# Patient Record
Sex: Female | Born: 1935 | Race: White | Hispanic: No | Smoking: Never smoker
Health system: Southern US, Community
[De-identification: ages and names within clinical notes are randomized; demographics above are authoritative.]

## PROBLEM LIST (undated history)

## (undated) DIAGNOSIS — R0981 Nasal congestion: Secondary | ICD-10-CM

## (undated) DIAGNOSIS — R05 Cough: Secondary | ICD-10-CM

## (undated) DIAGNOSIS — C801 Malignant (primary) neoplasm, unspecified: Secondary | ICD-10-CM

## (undated) DIAGNOSIS — R059 Cough, unspecified: Secondary | ICD-10-CM

## (undated) DIAGNOSIS — G4730 Sleep apnea, unspecified: Secondary | ICD-10-CM

## (undated) DIAGNOSIS — E785 Hyperlipidemia, unspecified: Secondary | ICD-10-CM

## (undated) DIAGNOSIS — I10 Essential (primary) hypertension: Secondary | ICD-10-CM

## (undated) DIAGNOSIS — C50919 Malignant neoplasm of unspecified site of unspecified female breast: Secondary | ICD-10-CM

## (undated) DIAGNOSIS — J329 Chronic sinusitis, unspecified: Secondary | ICD-10-CM

## (undated) DIAGNOSIS — M1990 Unspecified osteoarthritis, unspecified site: Secondary | ICD-10-CM

## (undated) DIAGNOSIS — A072 Cryptosporidiosis: Secondary | ICD-10-CM

## (undated) DIAGNOSIS — Z923 Personal history of irradiation: Secondary | ICD-10-CM

## (undated) DIAGNOSIS — E079 Disorder of thyroid, unspecified: Secondary | ICD-10-CM

## (undated) DIAGNOSIS — K219 Gastro-esophageal reflux disease without esophagitis: Secondary | ICD-10-CM

## (undated) HISTORY — PX: DILATION AND CURETTAGE OF UTERUS: SHX78

## (undated) HISTORY — PX: BREAST SURGERY: SHX581

## (undated) HISTORY — DX: Nasal congestion: R09.81

## (undated) HISTORY — DX: Gastro-esophageal reflux disease without esophagitis: K21.9

## (undated) HISTORY — DX: Malignant (primary) neoplasm, unspecified: C80.1

## (undated) HISTORY — PX: CARPAL TUNNEL RELEASE: SHX101

## (undated) HISTORY — DX: Cough, unspecified: R05.9

## (undated) HISTORY — DX: Disorder of thyroid, unspecified: E07.9

## (undated) HISTORY — DX: Hyperlipidemia, unspecified: E78.5

## (undated) HISTORY — DX: Sleep apnea, unspecified: G47.30

## (undated) HISTORY — DX: Cryptosporidiosis: A07.2

## (undated) HISTORY — DX: Chronic sinusitis, unspecified: J32.9

## (undated) HISTORY — PX: TONSILLECTOMY: SUR1361

## (undated) HISTORY — PX: OTHER SURGICAL HISTORY: SHX169

## (undated) HISTORY — PX: BREAST BIOPSY: SHX20

## (undated) HISTORY — DX: Essential (primary) hypertension: I10

## (undated) HISTORY — PX: ROTATOR CUFF REPAIR: SHX139

## (undated) HISTORY — DX: Unspecified osteoarthritis, unspecified site: M19.90

## (undated) HISTORY — PX: CHOLECYSTECTOMY: SHX55

## (undated) HISTORY — DX: Cough: R05

---

## 1998-07-24 ENCOUNTER — Ambulatory Visit: Admission: RE | Admit: 1998-07-24 | Discharge: 1998-07-24 | Payer: Self-pay | Admitting: Internal Medicine

## 1998-11-22 ENCOUNTER — Other Ambulatory Visit: Admission: RE | Admit: 1998-11-22 | Discharge: 1998-11-22 | Payer: Self-pay | Admitting: Obstetrics and Gynecology

## 1998-12-16 ENCOUNTER — Ambulatory Visit (HOSPITAL_COMMUNITY): Admission: RE | Admit: 1998-12-16 | Discharge: 1998-12-16 | Payer: Self-pay | Admitting: Obstetrics and Gynecology

## 1998-12-16 ENCOUNTER — Encounter: Payer: Self-pay | Admitting: Obstetrics and Gynecology

## 1998-12-19 ENCOUNTER — Other Ambulatory Visit: Admission: RE | Admit: 1998-12-19 | Discharge: 1998-12-19 | Payer: Self-pay | Admitting: Obstetrics and Gynecology

## 1999-03-04 ENCOUNTER — Ambulatory Visit (HOSPITAL_COMMUNITY): Admission: RE | Admit: 1999-03-04 | Discharge: 1999-03-04 | Payer: Self-pay | Admitting: Internal Medicine

## 1999-03-19 ENCOUNTER — Other Ambulatory Visit: Admission: RE | Admit: 1999-03-19 | Discharge: 1999-03-19 | Payer: Self-pay | Admitting: Obstetrics and Gynecology

## 1999-12-15 ENCOUNTER — Encounter: Admission: RE | Admit: 1999-12-15 | Discharge: 1999-12-15 | Payer: Self-pay | Admitting: Obstetrics and Gynecology

## 1999-12-15 ENCOUNTER — Encounter: Payer: Self-pay | Admitting: Obstetrics and Gynecology

## 2000-03-19 ENCOUNTER — Other Ambulatory Visit: Admission: RE | Admit: 2000-03-19 | Discharge: 2000-03-19 | Payer: Self-pay | Admitting: Obstetrics and Gynecology

## 2000-08-02 ENCOUNTER — Encounter: Admission: RE | Admit: 2000-08-02 | Discharge: 2000-10-31 | Payer: Self-pay | Admitting: Rheumatology

## 2000-10-15 ENCOUNTER — Other Ambulatory Visit: Admission: RE | Admit: 2000-10-15 | Discharge: 2000-10-15 | Payer: Self-pay | Admitting: Obstetrics and Gynecology

## 2000-10-15 ENCOUNTER — Encounter (INDEPENDENT_AMBULATORY_CARE_PROVIDER_SITE_OTHER): Payer: Self-pay

## 2000-10-18 ENCOUNTER — Ambulatory Visit (HOSPITAL_COMMUNITY): Admission: RE | Admit: 2000-10-18 | Discharge: 2000-10-18 | Payer: Self-pay | Admitting: Obstetrics and Gynecology

## 2000-10-18 ENCOUNTER — Encounter: Payer: Self-pay | Admitting: Obstetrics and Gynecology

## 2001-02-09 ENCOUNTER — Ambulatory Visit (HOSPITAL_BASED_OUTPATIENT_CLINIC_OR_DEPARTMENT_OTHER): Admission: RE | Admit: 2001-02-09 | Discharge: 2001-02-10 | Payer: Self-pay | Admitting: Orthopedic Surgery

## 2001-06-03 ENCOUNTER — Encounter: Payer: Self-pay | Admitting: Obstetrics and Gynecology

## 2001-06-03 ENCOUNTER — Encounter: Admission: RE | Admit: 2001-06-03 | Discharge: 2001-06-03 | Payer: Self-pay | Admitting: Obstetrics and Gynecology

## 2001-07-11 ENCOUNTER — Other Ambulatory Visit: Admission: RE | Admit: 2001-07-11 | Discharge: 2001-07-11 | Payer: Self-pay | Admitting: Obstetrics and Gynecology

## 2002-06-06 ENCOUNTER — Encounter: Admission: RE | Admit: 2002-06-06 | Discharge: 2002-06-06 | Payer: Self-pay | Admitting: Internal Medicine

## 2002-06-06 ENCOUNTER — Encounter: Payer: Self-pay | Admitting: Obstetrics and Gynecology

## 2003-06-15 ENCOUNTER — Encounter: Admission: RE | Admit: 2003-06-15 | Discharge: 2003-06-15 | Payer: Self-pay | Admitting: Obstetrics and Gynecology

## 2003-06-15 ENCOUNTER — Encounter: Payer: Self-pay | Admitting: Obstetrics and Gynecology

## 2004-07-09 ENCOUNTER — Encounter: Admission: RE | Admit: 2004-07-09 | Discharge: 2004-07-09 | Payer: Self-pay | Admitting: Obstetrics and Gynecology

## 2005-07-21 ENCOUNTER — Encounter: Admission: RE | Admit: 2005-07-21 | Discharge: 2005-07-21 | Payer: Self-pay | Admitting: Obstetrics and Gynecology

## 2005-12-09 ENCOUNTER — Ambulatory Visit (HOSPITAL_COMMUNITY): Admission: RE | Admit: 2005-12-09 | Discharge: 2005-12-09 | Payer: Self-pay | Admitting: Gastroenterology

## 2006-11-18 ENCOUNTER — Encounter: Admission: RE | Admit: 2006-11-18 | Discharge: 2006-11-18 | Payer: Self-pay | Admitting: Obstetrics and Gynecology

## 2006-11-29 ENCOUNTER — Ambulatory Visit: Payer: Self-pay | Admitting: Internal Medicine

## 2006-12-22 ENCOUNTER — Ambulatory Visit: Payer: Self-pay | Admitting: Internal Medicine

## 2007-12-09 ENCOUNTER — Encounter: Admission: RE | Admit: 2007-12-09 | Discharge: 2007-12-09 | Payer: Self-pay | Admitting: Endocrinology

## 2008-02-28 ENCOUNTER — Encounter: Admission: RE | Admit: 2008-02-28 | Discharge: 2008-02-28 | Payer: Self-pay | Admitting: Obstetrics and Gynecology

## 2008-04-30 ENCOUNTER — Encounter: Admission: RE | Admit: 2008-04-30 | Discharge: 2008-06-19 | Payer: Self-pay | Admitting: Neurology

## 2008-09-21 DIAGNOSIS — C50919 Malignant neoplasm of unspecified site of unspecified female breast: Secondary | ICD-10-CM

## 2008-09-21 HISTORY — DX: Malignant neoplasm of unspecified site of unspecified female breast: C50.919

## 2008-09-21 HISTORY — PX: BREAST LUMPECTOMY: SHX2

## 2009-04-21 DIAGNOSIS — C801 Malignant (primary) neoplasm, unspecified: Secondary | ICD-10-CM

## 2009-04-21 HISTORY — DX: Malignant (primary) neoplasm, unspecified: C80.1

## 2009-05-06 ENCOUNTER — Encounter: Admission: RE | Admit: 2009-05-06 | Discharge: 2009-05-06 | Payer: Self-pay | Admitting: Obstetrics and Gynecology

## 2009-05-10 ENCOUNTER — Encounter: Admission: RE | Admit: 2009-05-10 | Discharge: 2009-05-10 | Payer: Self-pay | Admitting: Obstetrics and Gynecology

## 2009-05-17 ENCOUNTER — Encounter: Admission: RE | Admit: 2009-05-17 | Discharge: 2009-05-17 | Payer: Self-pay | Admitting: Obstetrics and Gynecology

## 2009-05-30 ENCOUNTER — Encounter: Admission: RE | Admit: 2009-05-30 | Discharge: 2009-05-30 | Payer: Self-pay | Admitting: Surgery

## 2009-06-04 ENCOUNTER — Ambulatory Visit (HOSPITAL_BASED_OUTPATIENT_CLINIC_OR_DEPARTMENT_OTHER): Admission: RE | Admit: 2009-06-04 | Discharge: 2009-06-04 | Payer: Self-pay | Admitting: Surgery

## 2009-06-04 ENCOUNTER — Encounter (INDEPENDENT_AMBULATORY_CARE_PROVIDER_SITE_OTHER): Payer: Self-pay | Admitting: Surgery

## 2009-06-04 ENCOUNTER — Encounter: Admission: RE | Admit: 2009-06-04 | Discharge: 2009-06-04 | Payer: Self-pay | Admitting: Surgery

## 2009-06-04 HISTORY — PX: BREAST LUMPECTOMY W/ NEEDLE LOCALIZATION: SHX1266

## 2009-06-14 ENCOUNTER — Ambulatory Visit: Payer: Self-pay | Admitting: Oncology

## 2009-06-26 LAB — COMPREHENSIVE METABOLIC PANEL
ALT: 56 U/L — ABNORMAL HIGH (ref 0–35)
AST: 57 U/L — ABNORMAL HIGH (ref 0–37)
BUN: 23 mg/dL (ref 6–23)
Calcium: 9.5 mg/dL (ref 8.4–10.5)
Chloride: 104 mEq/L (ref 96–112)
Creatinine, Ser: 0.89 mg/dL (ref 0.40–1.20)
Total Bilirubin: 0.5 mg/dL (ref 0.3–1.2)

## 2009-06-26 LAB — CBC WITH DIFFERENTIAL/PLATELET
BASO%: 0.3 % (ref 0.0–2.0)
Basophils Absolute: 0 10*3/uL (ref 0.0–0.1)
EOS%: 2.9 % (ref 0.0–7.0)
HCT: 37.5 % (ref 34.8–46.6)
HGB: 12.5 g/dL (ref 11.6–15.9)
LYMPH%: 30.4 % (ref 14.0–49.7)
MCH: 26.7 pg (ref 25.1–34.0)
MCHC: 33.2 g/dL (ref 31.5–36.0)
MCV: 80.4 fL (ref 79.5–101.0)
MONO%: 10.4 % (ref 0.0–14.0)
NEUT%: 56 % (ref 38.4–76.8)
Platelets: 187 10*3/uL (ref 145–400)

## 2009-06-27 ENCOUNTER — Ambulatory Visit: Admission: RE | Admit: 2009-06-27 | Discharge: 2009-08-30 | Payer: Self-pay | Admitting: Radiation Oncology

## 2009-07-08 ENCOUNTER — Ambulatory Visit (HOSPITAL_COMMUNITY): Admission: RE | Admit: 2009-07-08 | Discharge: 2009-07-08 | Payer: Self-pay | Admitting: Oncology

## 2009-09-03 ENCOUNTER — Ambulatory Visit: Payer: Self-pay | Admitting: Oncology

## 2009-10-14 ENCOUNTER — Ambulatory Visit: Payer: Self-pay | Admitting: Oncology

## 2009-10-30 LAB — ESTRADIOL, ULTRA SENS: Estradiol, Ultra Sensitive: 18 pg/mL

## 2010-01-05 IMAGING — CT CT ABDOMEN W/ CM
2 of 5 series · 14 of 32 positions shown, 19 images · IV contrast (READICAT/WATER & [ID] OMNI 300)
Comparison: MRCP dated 12/09/05.

CLINICAL DATA: Abdominal and pelvic pain with left flank pain and nausea.  
ABDOMEN CT WITH CONTRAST:
TECHNIQUE: Multidetector CT imaging of the abdomen was performed following the standard protocol during bolus administration of intravenous contrast.
Contrast:  Oral contrast and 100 cc Omnipaque 300
TECHNIQUE: Multidetector CT imaging of the pelvis was performed following the standard protocol during bolus administration of intravenous contrast.

[Series 2: abdomen w/ · axial · 0.78mm/px · z∈[-345,-20]mm · 6 of 93 slices shown, 11 images]
[im 14/93  soft-tissue]
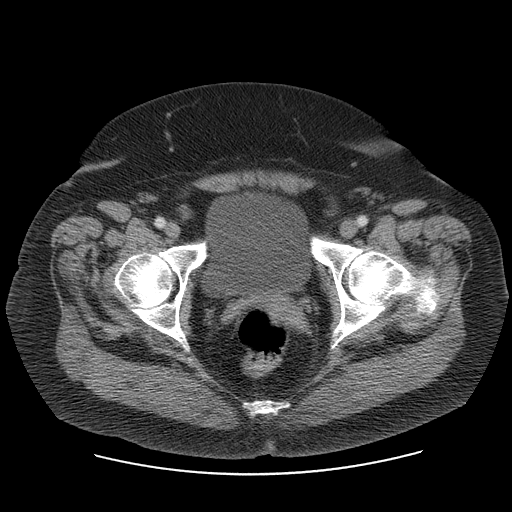
[im 14/93  bone]
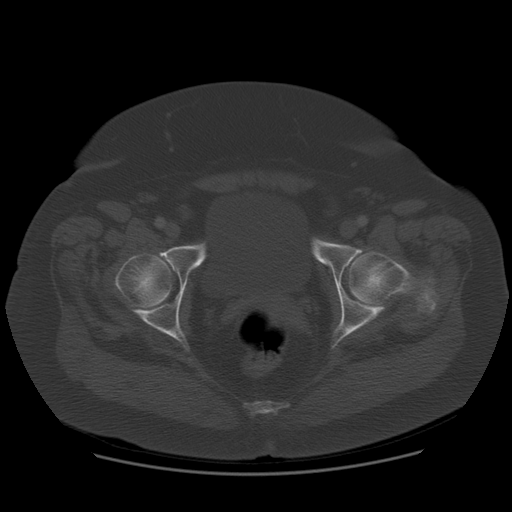
[im 27/93  soft-tissue]
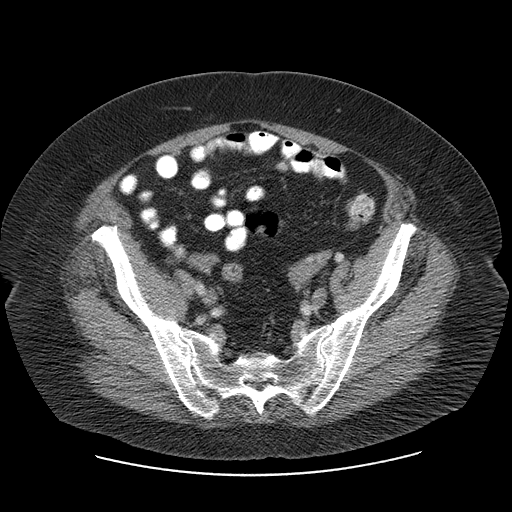
[im 40/93  soft-tissue]
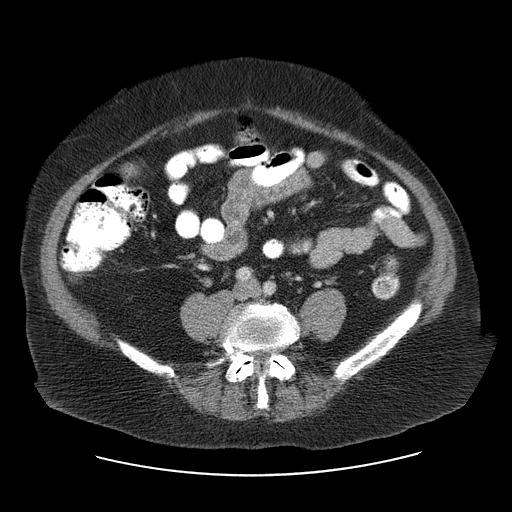
[im 40/93  lung]
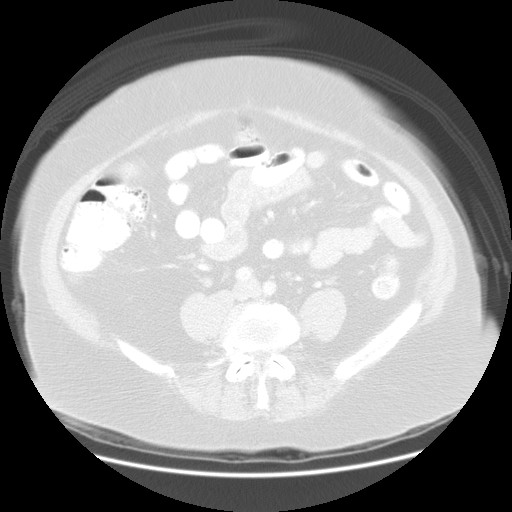
[im 53/93  soft-tissue]
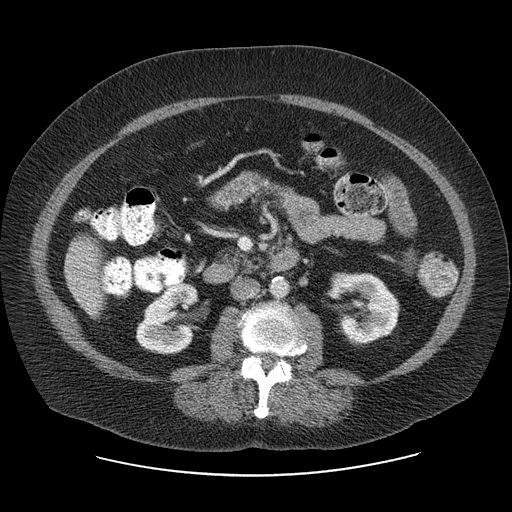
[im 53/93  lung]
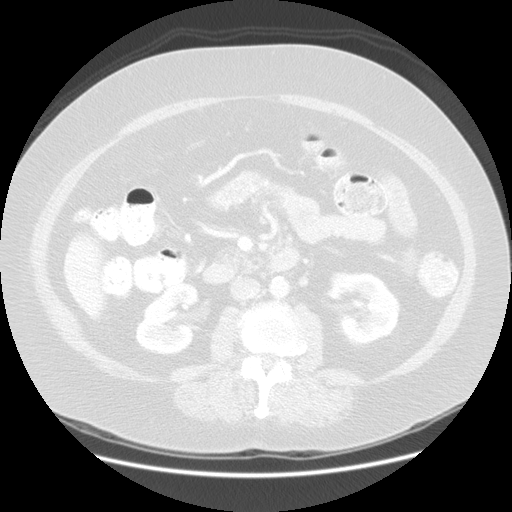
[im 66/93  soft-tissue]
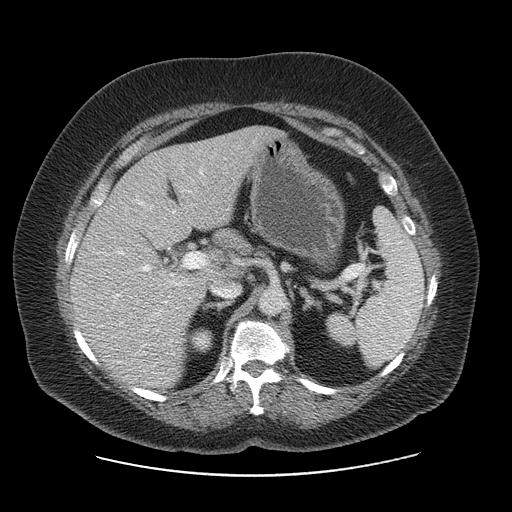
[im 66/93  lung]
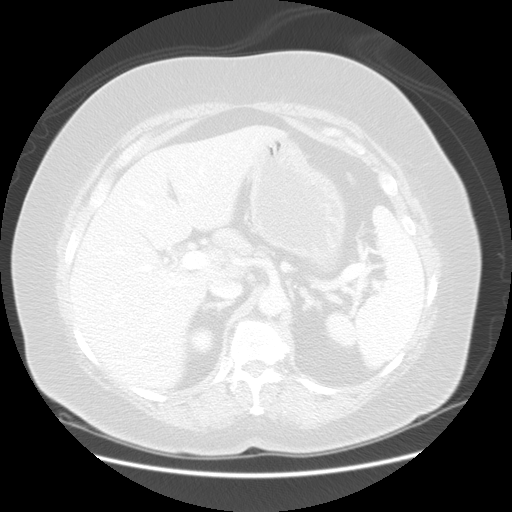
[im 79/93  soft-tissue]
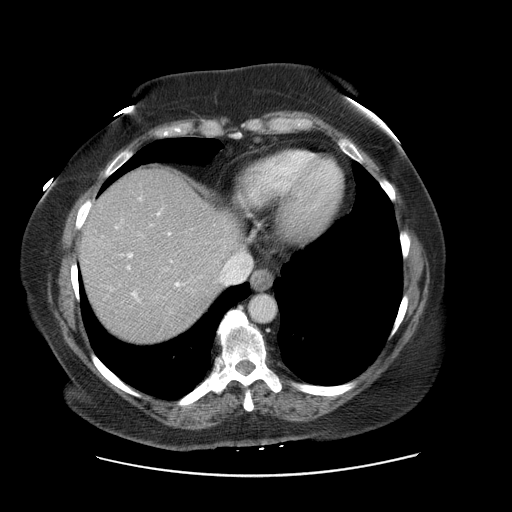
[im 79/93  lung]
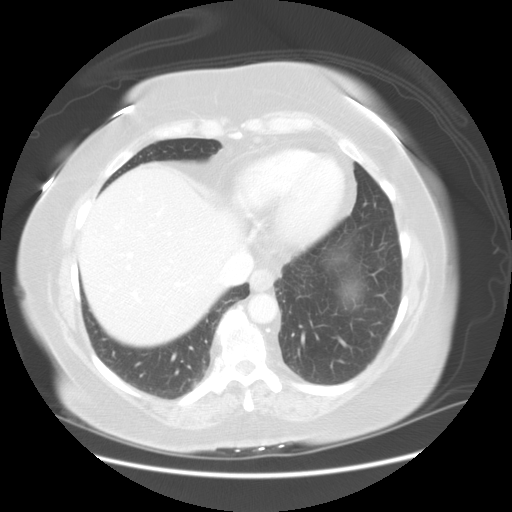

[Series 401: sagittal · sagittal · 0.92mm/px · 8 of 154 slices shown]
[im 16/154  soft-tissue]
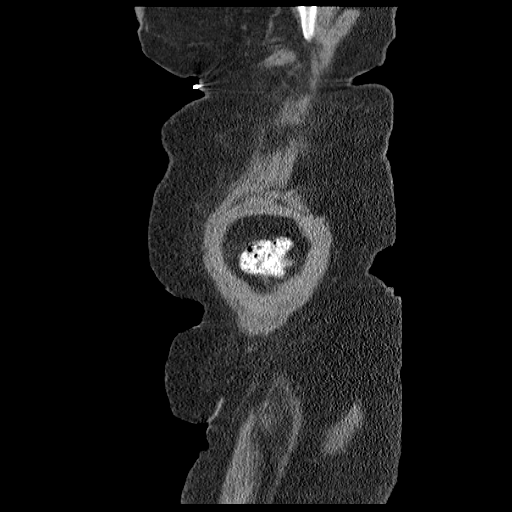
[im 31/154  soft-tissue]
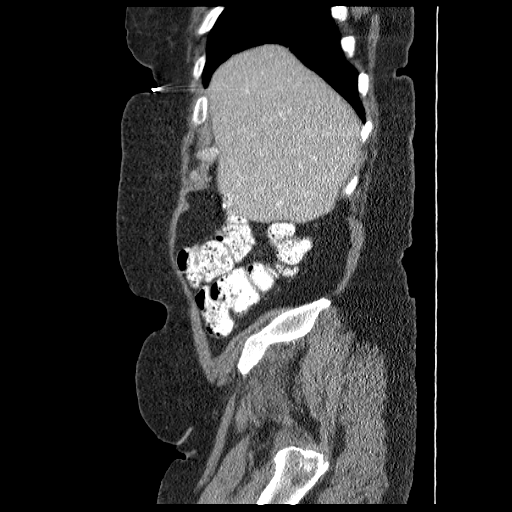
[im 46/154  soft-tissue]
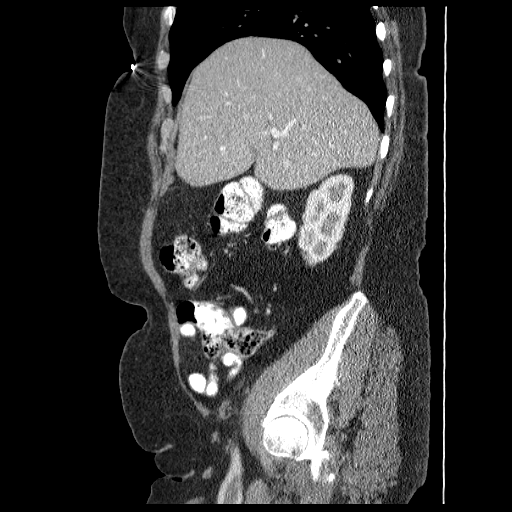
[im 62/154  soft-tissue]
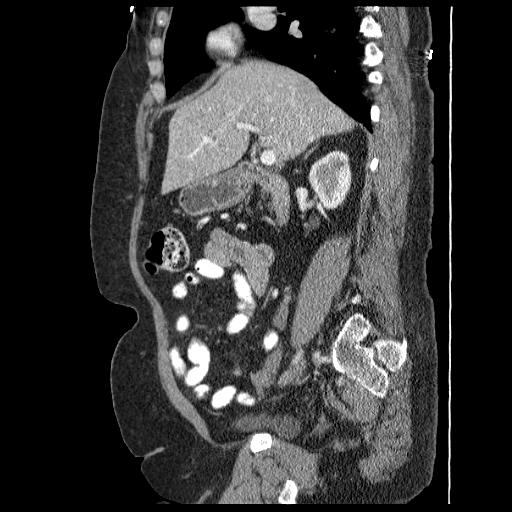
[im 92/154  soft-tissue]
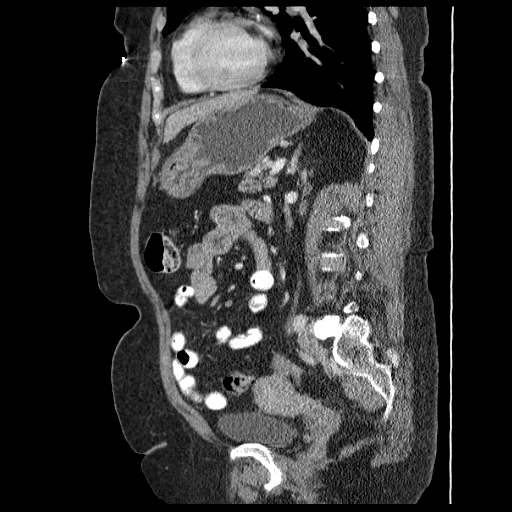
[im 108/154  soft-tissue]
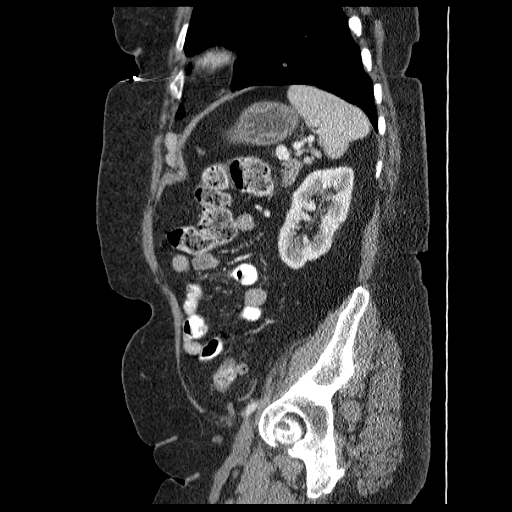
[im 123/154  soft-tissue]
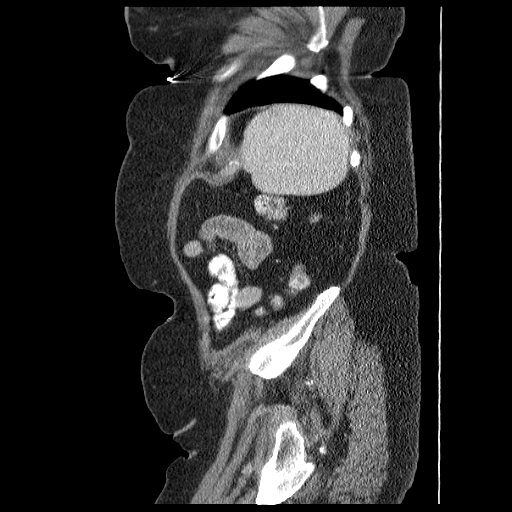
[im 138/154  soft-tissue]
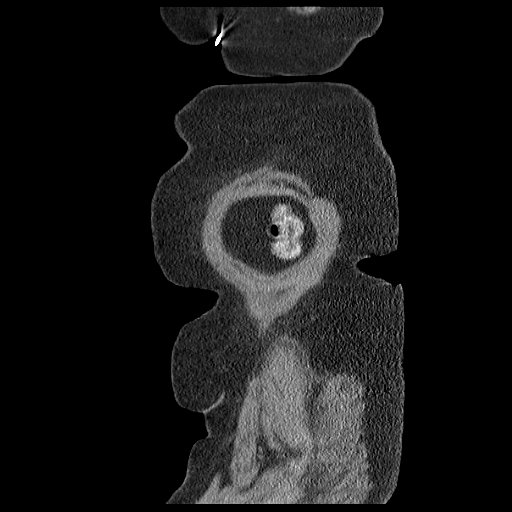

[14 of 32 positions shown; findings below may reference images not displayed]

FINDINGS: 6 mm left lower lobe nodule (image 14) and a 3 mm left lower lobe nodule (image 11) are noted and in retrospect were vaguely present on the prior MRCP.  
Fatty infiltration of the liver is identified.  No focal hepatic abnormalities noted.  
Spleen, adrenal glands, pancreas, and kidneys are unremarkable except for renal cortical thinning.  
No evidence of free fluid, biliary dilatation, or abdominal aortic aneurysm.  Upper limits of normal periportal and portocaval lymph nodes are stable from the last study.  No other enlarged lymph nodes are identified. 
No evidence of urinary calculi or hydronephrosis.
IMPRESSION: 1.   No acute abnormality.  
2.  Fatty infiltration of the liver. 
3.  Two small left lower lobe pulmonary nodules appear relatively unchanged from 12/09/05 MRCP ? consider limited CT followup in 1 year to insure 2 year stability.   
4.  Renal cortical thinning. 
PELVIS CT WITH CONTRAST:
FINDINGS: No evidence of urinary calculi.  A few upper limits of normal and mildly enlarged left pelvic lymph nodes are identified including a 1.3 x 2.3 cm left external iliac node (image 73).  
A moderate apex left lumbar scoliosis is identified. 
Degenerative disc disease in the lumbar spine noted.
IMPRESSION: Mildly enlarged left pelvic lymph node ? likely reactive but consider limited CT followup in 3 to 6 months or PET CT.

## 2010-01-13 ENCOUNTER — Ambulatory Visit: Payer: Self-pay | Admitting: Oncology

## 2010-01-14 LAB — CBC WITH DIFFERENTIAL/PLATELET
BASO%: 0.9 % (ref 0.0–2.0)
EOS%: 1.7 % (ref 0.0–7.0)
HCT: 37.1 % (ref 34.8–46.6)
HGB: 12.3 g/dL (ref 11.6–15.9)
LYMPH%: 22.1 % (ref 14.0–49.7)
MCHC: 33.3 g/dL (ref 31.5–36.0)
MCV: 82.5 fL (ref 79.5–101.0)
MONO#: 0.5 10*3/uL (ref 0.1–0.9)
MONO%: 6.9 % (ref 0.0–14.0)
NEUT#: 5.1 10*3/uL (ref 1.5–6.5)
NEUT%: 68.4 % (ref 38.4–76.8)
Platelets: 207 10*3/uL (ref 145–400)
RBC: 4.49 10*6/uL (ref 3.70–5.45)
WBC: 7.4 10*3/uL (ref 3.9–10.3)

## 2010-01-14 LAB — COMPREHENSIVE METABOLIC PANEL
Albumin: 4.3 g/dL (ref 3.5–5.2)
Creatinine, Ser: 1.12 mg/dL (ref 0.40–1.20)
Glucose, Bld: 104 mg/dL — ABNORMAL HIGH (ref 70–99)
Total Bilirubin: 0.6 mg/dL (ref 0.3–1.2)

## 2010-01-14 LAB — LACTATE DEHYDROGENASE: LDH: 157 U/L (ref 94–250)

## 2010-01-14 LAB — CANCER ANTIGEN 27.29: CA 27.29: 15 U/mL (ref 0–39)

## 2010-02-07 ENCOUNTER — Encounter: Admission: RE | Admit: 2010-02-07 | Discharge: 2010-02-07 | Payer: Self-pay | Admitting: Orthopedic Surgery

## 2010-05-21 ENCOUNTER — Encounter: Admission: RE | Admit: 2010-05-21 | Discharge: 2010-05-21 | Payer: Self-pay | Admitting: Obstetrics and Gynecology

## 2010-07-25 ENCOUNTER — Ambulatory Visit: Payer: Self-pay | Admitting: Oncology

## 2010-07-29 LAB — CBC WITH DIFFERENTIAL/PLATELET
EOS%: 2.5 % (ref 0.0–7.0)
HCT: 37.4 % (ref 34.8–46.6)
HGB: 12.4 g/dL (ref 11.6–15.9)
LYMPH%: 22.4 % (ref 14.0–49.7)
MCH: 27.4 pg (ref 25.1–34.0)
MCV: 82.7 fL (ref 79.5–101.0)
MONO#: 0.6 10*3/uL (ref 0.1–0.9)
NEUT#: 4.5 10*3/uL (ref 1.5–6.5)
Platelets: 201 10*3/uL (ref 145–400)
RBC: 4.53 10*6/uL (ref 3.70–5.45)
RDW: 16.1 % — ABNORMAL HIGH (ref 11.2–14.5)
lymph#: 1.5 10*3/uL (ref 0.9–3.3)

## 2010-07-29 LAB — COMPREHENSIVE METABOLIC PANEL
Albumin: 3.8 g/dL (ref 3.5–5.2)
Alkaline Phosphatase: 63 U/L (ref 39–117)
Calcium: 9.5 mg/dL (ref 8.4–10.5)
Creatinine, Ser: 0.98 mg/dL (ref 0.40–1.20)

## 2010-08-21 ENCOUNTER — Ambulatory Visit (HOSPITAL_COMMUNITY)
Admission: RE | Admit: 2010-08-21 | Discharge: 2010-08-21 | Payer: Self-pay | Source: Home / Self Care | Admitting: Obstetrics and Gynecology

## 2010-10-11 ENCOUNTER — Encounter: Payer: Self-pay | Admitting: Obstetrics and Gynecology

## 2010-10-12 ENCOUNTER — Encounter: Payer: Self-pay | Admitting: Obstetrics and Gynecology

## 2010-12-02 LAB — DIFFERENTIAL
Basophils Absolute: 0 10*3/uL (ref 0.0–0.1)
Eosinophils Absolute: 0.1 10*3/uL (ref 0.0–0.7)
Monocytes Relative: 9 % (ref 3–12)
Neutrophils Relative %: 64 % (ref 43–77)

## 2010-12-02 LAB — COMPREHENSIVE METABOLIC PANEL
ALT: 28 U/L (ref 0–35)
AST: 35 U/L (ref 0–37)
Albumin: 4 g/dL (ref 3.5–5.2)
Alkaline Phosphatase: 61 U/L (ref 39–117)
BUN: 23 mg/dL (ref 6–23)
CO2: 24 mEq/L (ref 19–32)
Chloride: 103 mEq/L (ref 96–112)
GFR calc Af Amer: >60 mL/min (ref >60)
GFR calc non Af Amer: >60 mL/min (ref >60)
Glucose, Bld: 118 mg/dL — ABNORMAL HIGH (ref 70–99)
Sodium: 137 mEq/L (ref 135–145)
Total Bilirubin: 0.2 mg/dL — ABNORMAL LOW (ref 0.3–1.2)
Total Protein: 7.4 g/dL (ref 6.0–8.3)

## 2010-12-02 LAB — CBC
HCT: 37.3 % (ref 36.0–46.0)
Hemoglobin: 12.2 g/dL (ref 12.0–15.0)
MCHC: 32.6 g/dL (ref 30.0–36.0)
MCV: 83.6 fL (ref 78.0–100.0)
Platelets: 197 10*3/uL (ref 150–400)
RBC: 4.46 MIL/uL (ref 3.87–5.11)
WBC: 5.4 10*3/uL (ref 4.0–10.5)

## 2010-12-26 LAB — CBC
RBC: 4.81 MIL/uL (ref 3.87–5.11)
RDW: 16.7 % — ABNORMAL HIGH (ref 11.5–15.5)
WBC: 5.8 10*3/uL (ref 4.0–10.5)

## 2010-12-26 LAB — COMPREHENSIVE METABOLIC PANEL
Alkaline Phosphatase: 63 U/L (ref 39–117)
BUN: 15 mg/dL (ref 6–23)
CO2: 29 mEq/L (ref 19–32)
Chloride: 100 mEq/L (ref 96–112)
Creatinine, Ser: 0.86 mg/dL (ref 0.4–1.2)
GFR calc non Af Amer: >60 mL/min (ref >60)
Glucose, Bld: 150 mg/dL — ABNORMAL HIGH (ref 70–99)
Total Bilirubin: 0.8 mg/dL (ref 0.3–1.2)

## 2010-12-26 LAB — DIFFERENTIAL
Basophils Absolute: 0 10*3/uL (ref 0.0–0.1)
Basophils Relative: 0 % (ref 0–1)
Lymphocytes Relative: 28 % (ref 12–46)
Neutro Abs: 3.6 10*3/uL (ref 1.7–7.7)

## 2010-12-26 LAB — LACTATE DEHYDROGENASE: LDH: 223 U/L (ref 94–250)

## 2011-01-07 ENCOUNTER — Other Ambulatory Visit: Payer: Self-pay | Admitting: Gastroenterology

## 2011-02-06 NOTE — Assessment & Plan Note (Signed)
[name]                             PULMONARY OFFICE NOTE   NAME:Bond, Barbara ARRAS                     MRN:          [id number]  DATE:11/29/2006                            DOB:          07/15/1935    PROBLEM:  I had worked with this 76 year old woman previously at  [facility] where she was seen in 2004 for complaint of  sinusitis.  She is self referred, back to establish now with primary  complaint being discomfort with her eyes.   HISTORY:  For a year she says her eyes have been collecting mucous.  She  had a problem with dry eyes and had plugs inserted, then was put on  Restasis.  She questions if there is an allergy mechanism to her eye  discomfort.  Remote allergy skin testing was positive, but she was not  put on vaccine.  Her mouth does not get dry.  She likes SinoFresh nasal  spray.  Her chest feels comfortable.  There had been previous question  of sleep apnea in 1997 when her husband was complaining about snoring  and apnea events.  A sleep study in 1999 had showed an index of only 4  per hour, which is normal.  Pulmonary function tests in 2000 had been  normal with a methacholine inhalation challenge at that time, negative  for hyperreactive airways.   MEDICATIONS:  1. Tenoretic 1/2 x 0.25 mg.  2. Vytorin 10/10.  3. Aspirin 81 mg.  4. Synthroid 88 mcg.  5. Omeprazole 20 mg.  6. SinoFresh nasal spray.   DRUG INTOLERANCE:  CLARITIN.  SHE BLAMED FOR CAUSING COUGH.   REVIEW OF SYSTEMS:  Occasional frontal headaches, acid indigestion,  sneezing, stiffness in hands and feet.  No purulent or bloody discharge,  chest pain or palpation, cough or wheeze, adenopathy or rash.   PAST MEDICAL HISTORY:  1. Hypertension.  2. Elevated cholesterol.  3. Allergic rhinitis.  4. Hypothyroid.   SURGERIES:  1. Cholecystectomy.  2. Shoulder repair.  3. Tonsillectomy.   No medication intolerance to latex, contrast dye, or aspirin.   SULFA  causes stomach upset.   SOCIAL HISTORY:  Never smoked, although she was married to a smoker  before he died.  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   FAMILY HISTORY:  Mother with allergic rhinitis.  Both parents with heart  disease.  Several in the family with diabetes.   OBJECTIVE:  Weight 195 pounds, BP 112/72, pulse regular 71, room air  saturation 95%.  She is squinting at me a little bit, but conjunctivae are not obviously  injected.  Oral mucosa is not dry.  I seen no postnasal drainage, she  can breathe comfortably through her nose with mouth closed.  There is no neck vein distension or adenopathy.  Voice quality is  normal.  Chest is quiet and clear.  Heart sounds regular without murmur.   IMPRESSION:  Complaint of dry eyes without generalized mucosal dryness.  She asks if this is an allergic conjunctivitis.   PLAN:  She will try Lacri-Lube ointment, also try Crolom ophthalmic  solution 0.4% 1 or 2 drops in each eye 4-6 times daily.  Schedule return  3 weeks with option to skin test if appropriate.     Clinton D. Maple Hudson, MD, Tonny Bollman, FACP  Electronically Signed    CDY/MedQ  DD: 12/02/2006  DT: 12/04/2006  Job #: [id number]   cc:   Jeannett Senior A. Evlyn Kanner, M.D.  Marcelyn Bruins. Nile Riggs, M.D.

## 2011-02-06 NOTE — Assessment & Plan Note (Signed)
[facility]                             PULMONARY OFFICE NOTE   NAME:[name], [name]                     MRN:          [id number]  DATE:12/22/2006                            DOB:          10/06/1934    PROBLEM:  Allergy follow up for this 76 year old never smoker,  previously followed at [facility] and returning now for  allergy testing related to her complaint of eye discomfort. She says  LacriLube and Cromolyn opthalmic ointment did not help. She complains of  a mess like glue sticky and itching, but not burning in both eyes. She  continues to work with her eye doctor who has tried a variety of  medications.   MEDICATIONS:  1. Tenoretic 0.25 mg x1/2.  2. Vytorin 10/10.  3. Aspirin 81 mg.  4. Synthroid 88 mcg.  5. Omeprazole 20 mg.  6. Sino Fresh nasal spray.  Drug intolerant of CLARITIN which caused cough, and SULFA.   OBJECTIVE:  Weight 195 pounds, blood pressure 110/78, pulse regular 71,  room air saturation 95%. She is squinting a bit. Globes look normal.  Lacrimal fluid is unremarkable. Visually, there is insignificant  conjunctival injection. Her nasal mucosa is not edematous. There is no  post nasal drip. Lungs are clear. Heart sounds are normal. I do not find  adenopathy.   SKIN TEST:  Positive histamine, negative diluent controls. Positive  intradermal reactions at fairly low titer for a variety of grass, weed,  and tree pollens, minimally for dog and dust mite. We discussed allergy  vaccine as an option. She emphasizes that she hates shots and wishes  to keep trying with medications.   IMPRESSION:  This may be allergic conjunctivitis, but lack of response  to medications is atypical.   PLAN:  Try Veramyst 1 or 2 sprays each nostril daily. If the sample is  of real benefit, we will call in prescription. For now, she is going to  return p.r.n.     Clinton D. Maple Hudson, MD, Tonny Bollman, FACP  Electronically Signed    CDY/MedQ  DD: 12/25/2006  DT: 12/26/2006  Job #: [id number]

## 2011-02-06 NOTE — Op Note (Signed)
[place]  Patient:    [name], [name]                     MRN: [id number] Proc. Date: 02/09/01 Adm. Date:  [id number] Attending:  Teena Dunk                           Operative Report  PREOPERATIVE DIAGNOSIS: 1. A 5 cm rotator cuff tear, left shoulder. 2. Degenerative tearing anterior-superior labrum. 3. Impingement with acromioclavicular joint arthritis.  POSTOPERATIVE DIAGNOSIS: 1. A 5 cm rotator cuff tear, left shoulder. 2. Degenerative tearing anterior-superior labrum. 3. Impingement with acromioclavicular joint arthritis.  OPERATION PERFORMED: 1. Open rotator cuff repair acromioplasty, left shoulder. 2. Arthroscopic debridement, torn labrum, left shoulder. 3. Open distal clavicle excision.  SURGEON:  Sharlot Gowda., M.D.  ANESTHESIA:  General.  INDICATIONS FOR PROCEDURE:  This 76 year old female with a MRI proven increasingly symptomatic left rotator cuff tear thought to be amenable to overnight hospitalization.  DESCRIPTION OF PROCEDURE:  Arthroscope through posterior anterior portal. There was fraying and tearing of the anterior superior labrum, biceps tendon intact.  This was debrided, no degenerative change of the glenohumeral joint. Intra-articular debridement was carried out followed by debridement of the rotator cuff tear which was about 3 to 4 cm in length.  It was debrided followed by conversion to an open procedure.  Deltoid splitting incision 3 cm distal to the tip of the acromion, bisecting the acromion AC joint.  [facility] joint was degenerative, was excised over about 1 cm followed by resection of about 8 to 9 mm of the anterior leading edge of the acromion to relieve impingement.  ____________ cuff tear.  Edges were fashioned with a 15 blade. Cuff was mobilized.  Approximately 5 Mitek anchors were placed to create basically a watertight repair of the rotator cuff. The cuff repair was under no  undue tension.  Closure of the deltoid was effected with interrupted #1 Ethibond sutures, 2-0 Vicryl on the subcutaneous tissues, skin with Monocryl, 4-0 portals with nylon.  Wound was infiltrated with Marcaine, an additional amount of Marcaine into the joint.  A lightly compressive sterile dressing and sling applied.  Taken to recovery room in stable condition. DD:  02/09/01 TD:  02/09/01 Job: [id number]

## 2011-02-09 ENCOUNTER — Other Ambulatory Visit: Payer: Self-pay | Admitting: Oncology

## 2011-02-09 ENCOUNTER — Encounter (HOSPITAL_BASED_OUTPATIENT_CLINIC_OR_DEPARTMENT_OTHER): Payer: Medicare Other | Admitting: Oncology

## 2011-02-09 DIAGNOSIS — C50919 Malignant neoplasm of unspecified site of unspecified female breast: Secondary | ICD-10-CM

## 2011-02-09 DIAGNOSIS — Z170 Estrogen receptor positive status [ER+]: Secondary | ICD-10-CM

## 2011-02-09 LAB — CBC WITH DIFFERENTIAL/PLATELET
BASO%: 1.5 % (ref 0.0–2.0)
Eosinophils Absolute: 0.1 10*3/uL (ref 0.0–0.5)
LYMPH%: 29.3 % (ref 14.0–49.7)
MCHC: 33 g/dL (ref 31.5–36.0)
MCV: 82.3 fL (ref 79.5–101.0)
MONO#: 0.5 10*3/uL (ref 0.1–0.9)
MONO%: 7.9 % (ref 0.0–14.0)
NEUT#: 3.6 10*3/uL (ref 1.5–6.5)
RBC: 4.68 10*6/uL (ref 3.70–5.45)
RDW: 15.4 % — ABNORMAL HIGH (ref 11.2–14.5)
WBC: 6.1 10*3/uL (ref 3.9–10.3)

## 2011-02-09 LAB — COMPREHENSIVE METABOLIC PANEL
ALT: 39 U/L — ABNORMAL HIGH (ref 0–35)
Albumin: 4.4 g/dL (ref 3.5–5.2)
Alkaline Phosphatase: 59 U/L (ref 39–117)
Glucose, Bld: 106 mg/dL — ABNORMAL HIGH (ref 70–99)
Potassium: 4.4 mEq/L (ref 3.5–5.3)
Sodium: 138 mEq/L (ref 135–145)
Total Bilirubin: 0.7 mg/dL (ref 0.3–1.2)
Total Protein: 7.4 g/dL (ref 6.0–8.3)

## 2011-02-09 LAB — VITAMIN D 25 HYDROXY (VIT D DEFICIENCY, FRACTURES): Vit D, 25-Hydroxy: 51 ng/mL (ref 30–89)

## 2011-05-05 ENCOUNTER — Other Ambulatory Visit: Payer: Self-pay | Admitting: Obstetrics and Gynecology

## 2011-05-05 DIAGNOSIS — Z853 Personal history of malignant neoplasm of breast: Secondary | ICD-10-CM

## 2011-06-24 ENCOUNTER — Ambulatory Visit
Admission: RE | Admit: 2011-06-24 | Discharge: 2011-06-24 | Disposition: A | Discharge status: Home / Self Care | Payer: Medicare Other | Source: Ambulatory Visit | Attending: Obstetrics and Gynecology | Admitting: Obstetrics and Gynecology

## 2011-06-24 DIAGNOSIS — Z853 Personal history of malignant neoplasm of breast: Secondary | ICD-10-CM

## 2011-06-27 IMAGING — CR DG CHEST 2V
2 series · 2 of 2 positions shown · non-contrast
Comparison: None

CLINICAL DATA: Preop exam for breast surgery

CHEST - 2 VIEW

[view not recorded (1 of 2)]
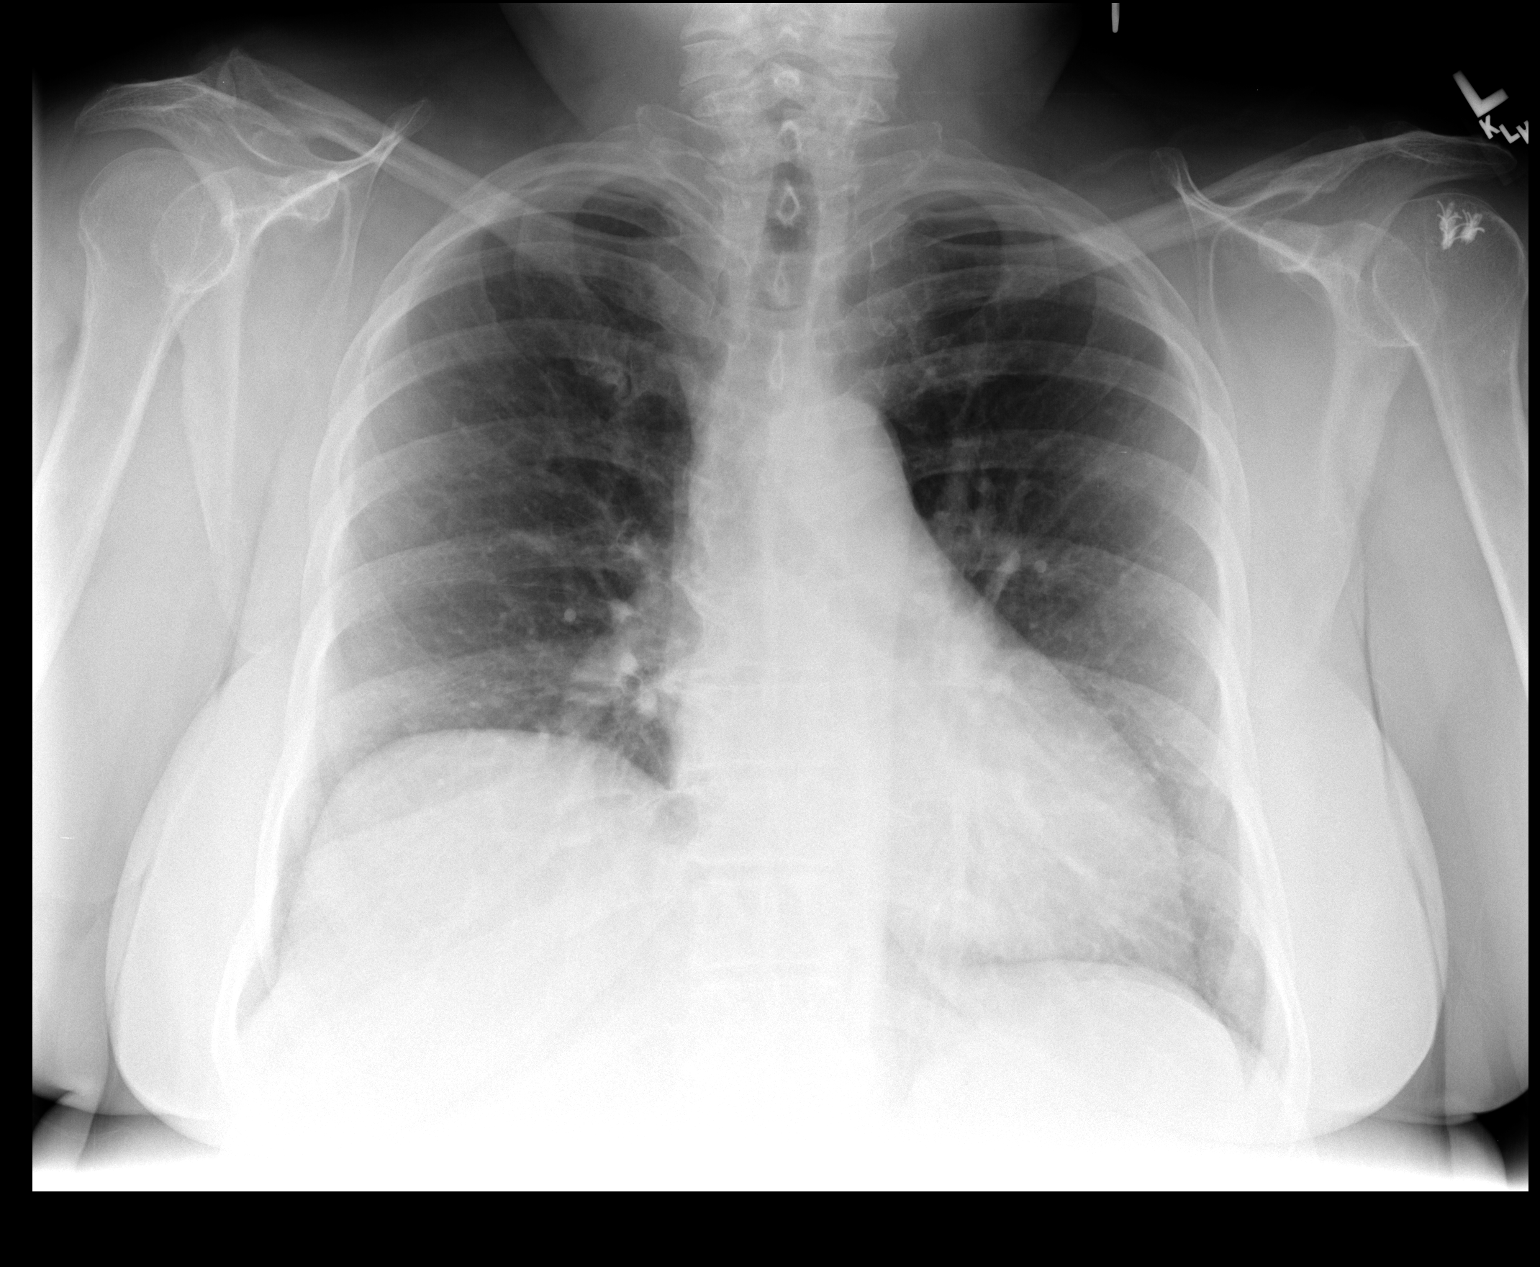

[view not recorded (2 of 2)]
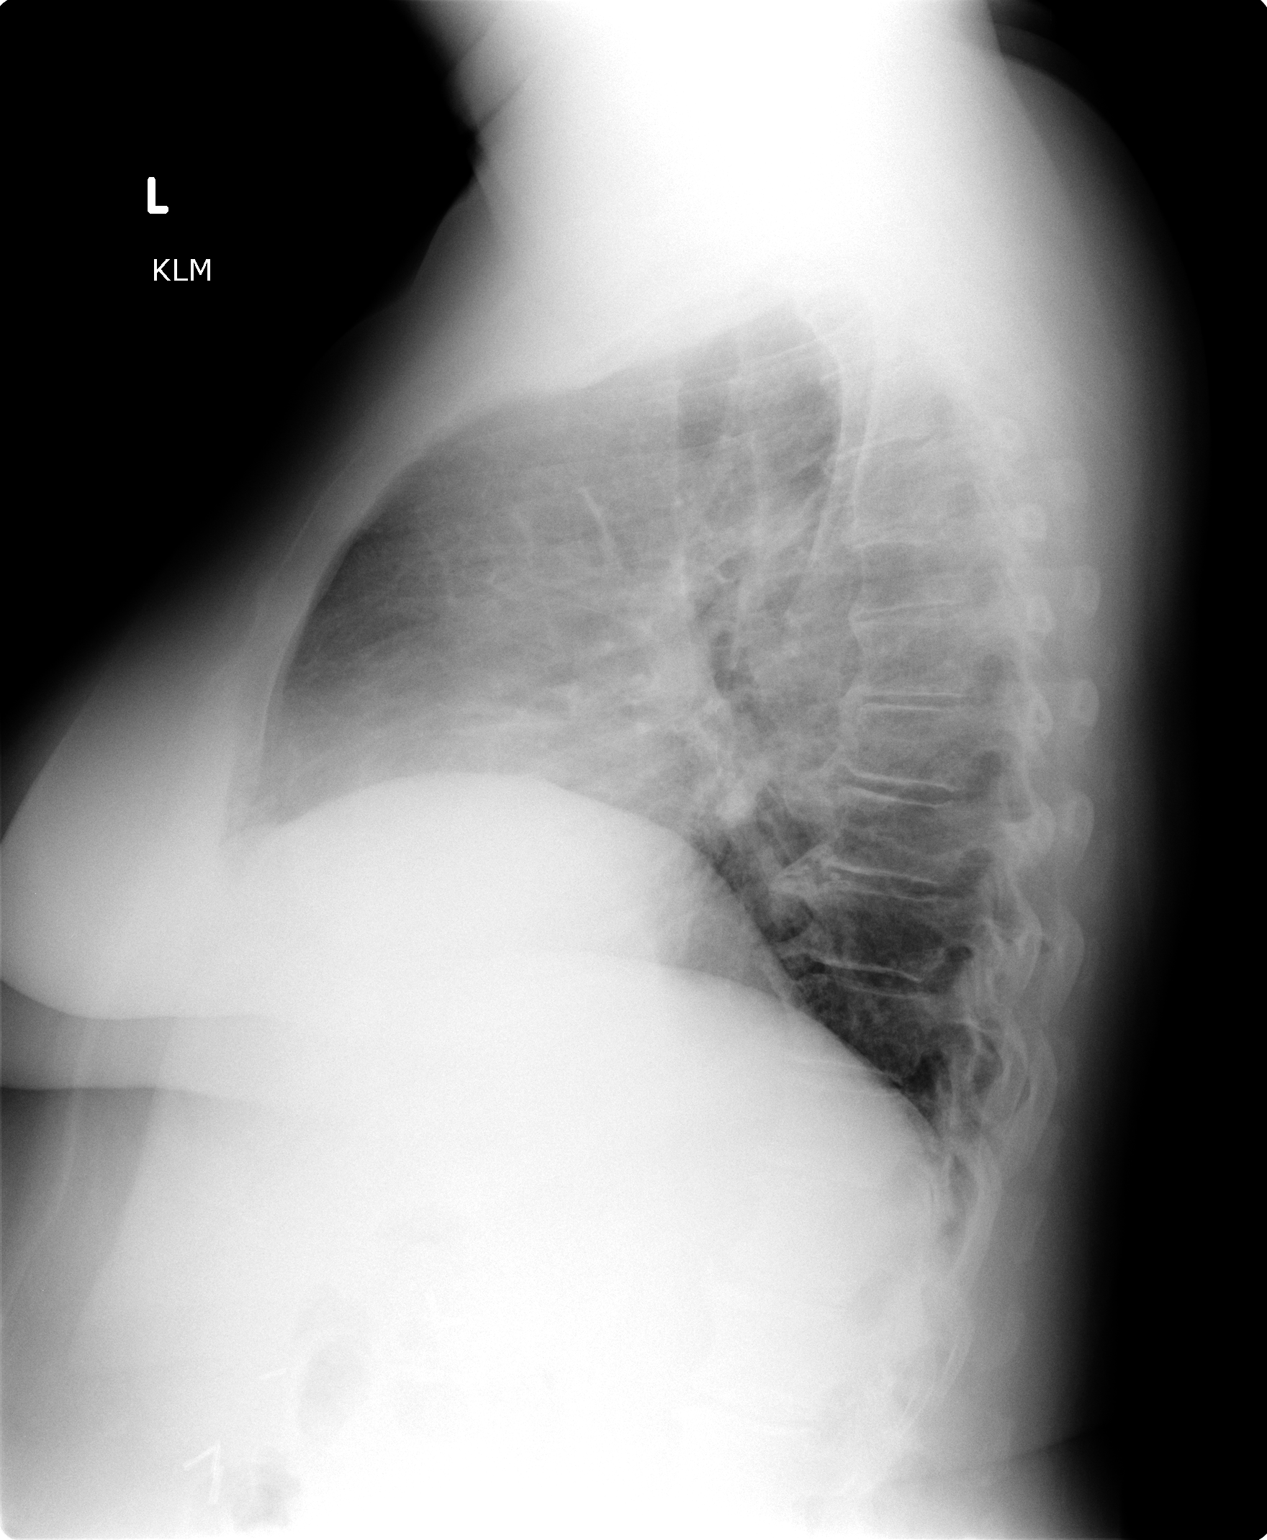

[2 of 2 positions shown; findings below may reference images not displayed]

FINDINGS: Normal mediastinum and cardiac silhouette.  Costophrenic
angles are clear.  No evidence effusion, infiltrate, or
pneumothorax.
IMPRESSION: No acute cardiopulmonary process.

## 2011-08-24 ENCOUNTER — Ambulatory Visit (HOSPITAL_BASED_OUTPATIENT_CLINIC_OR_DEPARTMENT_OTHER): Payer: Medicare Other | Admitting: Oncology

## 2011-08-24 ENCOUNTER — Ambulatory Visit (HOSPITAL_BASED_OUTPATIENT_CLINIC_OR_DEPARTMENT_OTHER): Payer: Medicare Other | Admitting: Lab

## 2011-08-24 ENCOUNTER — Telehealth: Payer: Self-pay | Admitting: *Deleted

## 2011-08-24 DIAGNOSIS — C50919 Malignant neoplasm of unspecified site of unspecified female breast: Secondary | ICD-10-CM

## 2011-08-24 DIAGNOSIS — E559 Vitamin D deficiency, unspecified: Secondary | ICD-10-CM

## 2011-08-24 DIAGNOSIS — Z860100 Personal history of colon polyps, unspecified: Secondary | ICD-10-CM

## 2011-08-24 DIAGNOSIS — Z23 Encounter for immunization: Secondary | ICD-10-CM

## 2011-08-24 LAB — CBC WITH DIFFERENTIAL/PLATELET
Basophils Absolute: 0 10*3/uL (ref 0.0–0.1)
Eosinophils Absolute: 0.1 10*3/uL (ref 0.0–0.5)
HGB: 13.1 g/dL (ref 11.6–15.9)
LYMPH%: 24.7 % (ref 14.0–49.7)
MCV: 82.8 fL (ref 79.5–101.0)
MONO#: 0.4 10*3/uL (ref 0.1–0.9)
NEUT#: 3.7 10*3/uL (ref 1.5–6.5)
Platelets: 172 10*3/uL (ref 145–400)
RBC: 4.75 10*6/uL (ref 3.70–5.45)
RDW: 15.1 % — ABNORMAL HIGH (ref 11.2–14.5)
WBC: 5.7 10*3/uL (ref 3.9–10.3)

## 2011-08-24 LAB — COMPREHENSIVE METABOLIC PANEL
Albumin: 4.3 g/dL (ref 3.5–5.2)
BUN: 19 mg/dL (ref 6–23)
CO2: 26 mEq/L (ref 19–32)
Glucose, Bld: 115 mg/dL — ABNORMAL HIGH (ref 70–99)
Potassium: 4.6 mEq/L (ref 3.5–5.3)
Sodium: 137 mEq/L (ref 135–145)
Total Protein: 7.3 g/dL (ref 6.0–8.3)

## 2011-08-24 LAB — CANCER ANTIGEN 27.29: CA 27.29: 15 U/mL (ref 0–39)

## 2011-08-24 LAB — VITAMIN D 25 HYDROXY (VIT D DEFICIENCY, FRACTURES): Vit D, 25-Hydroxy: 63 ng/mL (ref 30–89)

## 2011-08-24 MED ORDER — INFLUENZA VIRUS VACC SPLIT PF IM SUSP
0.5000 mL | INTRAMUSCULAR | Status: AC
  Administered 2011-08-24: 0.5 mL via INTRAMUSCULAR
  Filled 2011-08-24: qty 0.5

## 2011-08-24 NOTE — Progress Notes (Signed)
Hematology and Oncology Follow Up Visit  [name] [id number] 03-Feb-1935 76 y.o. 08/24/2011 10:10 AM   Principle Diagnosis: T1cNo breast cancer s/p lumpectomy 2010, s/p xrt 07/2009, now on letrozole Hx of colonic polyps   Interim History:  Pt is doing well, no current problems with letrozole. Last mamm 10/12.  Medications: I have reviewed the patient's current medications.  Allergies: Allergies not on file  Past Medical History, Surgical history, Social history, and Family History were reviewed and updated.  Review of Systems: Constitutional:  Negative for fever, chills, night sweats, anorexia, weight loss, pain. Cardiovascular: no chest pain or dyspnea on exertion Respiratory: no cough, shortness of breath, or wheezing Neurological: no TIA or stroke symptoms Dermatological: negative ENT: negative Skin Gastrointestinal: no abdominal pain, change in bowel habits, or black or bloody stools Genito-Urinary: no dysuria, trouble voiding, or hematuria Hematological and Lymphatic: negative Breast: negative for breast lumps Musculoskeletal: negative Remaining ROS negative.  Physical Exam: Blood pressure 122/76, pulse 67, temperature 98.6 F (37 C), height 5\' 2"  (1.575 m), weight 189 lb 3.2 oz (85.821 kg). ECOG:  General appearance: alert, cooperative and appears stated age Head: Normocephalic, without obvious abnormality, atraumatic Neck: no adenopathy, no carotid bruit, no JVD, supple, symmetrical, trachea midline and thyroid not enlarged, symmetric, no tenderness/mass/nodules Lymph nodes: Cervical, supraclavicular, and axillary nodes normal. Cardiac : normal Pulmonary:normal Breasts:rt and lt w/out masses, skin changes Abdomen: normal Extremities normal Neuro:normal  Lab Results: Lab Results  Component Value Date   WBC 5.7 08/24/2011   HGB 13.1 08/24/2011   HCT 39.4 08/24/2011   MCV 82.8 08/24/2011   PLT 172 08/24/2011     Chemistry      Component Value Date/Time     NA 138 02/09/2011 1031   NA 138 02/09/2011 1031   NA 138 02/09/2011 1031   K 4.4 02/09/2011 1031   K 4.4 02/09/2011 1031   K 4.4 02/09/2011 1031   CL 103 02/09/2011 1031   CL 103 02/09/2011 1031   CL 103 02/09/2011 1031   CO2 24 02/09/2011 1031   CO2 24 02/09/2011 1031   CO2 24 02/09/2011 1031   BUN 20 02/09/2011 1031   BUN 20 02/09/2011 1031   BUN 20 02/09/2011 1031   CREATININE 0.83 02/09/2011 1031   CREATININE 0.83 02/09/2011 1031   CREATININE 0.83 02/09/2011 1031      Component Value Date/Time   CALCIUM 9.7 02/09/2011 1031   CALCIUM 9.7 02/09/2011 1031   CALCIUM 9.7 02/09/2011 1031   ALKPHOS 59 02/09/2011 1031   ALKPHOS 59 02/09/2011 1031   ALKPHOS 59 02/09/2011 1031   AST 45* 02/09/2011 1031   AST 45* 02/09/2011 1031   AST 45* 02/09/2011 1031   ALT 39* 02/09/2011 1031   ALT 39* 02/09/2011 1031   ALT 39* 02/09/2011 1031   BILITOT 0.7 02/09/2011 1031   BILITOT 0.7 02/09/2011 1031   BILITOT 0.7 02/09/2011 1031       Radiological Studies: chest X-ray n/a Mammogram 10/12-nl    Bone  density pending  Impression and Plan: Pt is doing well, tolerating letrozole. i have encouraged exercise. She has f/u with dr Ezzard Standing in 1/13. I have scheduled her for a bone density in 12/12. F/u 6 months   More than 50% of the visit was spent in patient-related counselling   Caressa Scearce, MD 12/3/201210:10 AM

## 2011-08-24 NOTE — Telephone Encounter (Signed)
left voice message to inform the patient of the bone density on 09-08-2011 at [facility] on [address]

## 2011-09-24 ENCOUNTER — Ambulatory Visit (INDEPENDENT_AMBULATORY_CARE_PROVIDER_SITE_OTHER): Payer: Self-pay | Admitting: Surgery

## 2011-09-24 ENCOUNTER — Encounter (INDEPENDENT_AMBULATORY_CARE_PROVIDER_SITE_OTHER): Payer: Self-pay | Admitting: Surgery

## 2011-09-24 ENCOUNTER — Ambulatory Visit (INDEPENDENT_AMBULATORY_CARE_PROVIDER_SITE_OTHER): Payer: Medicare Other | Admitting: Surgery

## 2011-09-24 VITALS — BP 162/80 | HR 67 | Temp 98.0°F | Ht 62.0 in | Wt 187.2 lb

## 2011-09-24 DIAGNOSIS — Z853 Personal history of malignant neoplasm of breast: Secondary | ICD-10-CM

## 2011-09-24 NOTE — Progress Notes (Signed)
[facility]  Ovidio Kin, MD,  FACS [address] [place]  [address] [place], [place]    [address] Phone:  [phone] FAX:  [phone]   Re:   [name] DOB:   06/08/1935 MRN:   [id number]  ASSESSMENT AND PLAN: 1.  T1c, N0, left breast cancer  Lumpectomy - 06/04/2009  Followed by Drs Donnie Coffin and Mitzi Hansen.  On Letrozole.  She has tried other aromatase inhibitors, but has had trouble with them.  She is disease free and will see me in 6 months.  2.  Chronic fatigue symptoms, better. 3.  History of gastric polyps.  No further follow up planned. 4.  Uterine polyp removed by Dr. Ronnell Freshwater, Dec. 2011. 5.  Ears running.  HISTORY OF PRESENT ILLNESS: Chief Complaint  Patient presents with  . Breast Cancer Long Term Follow Up    yearly br ck    [name] is a 77 y.o. (DOB: 1934/11/04)  white female who is a patient of Julian Hy, MD and comes to me today for follow up of left breast cancer.  Doing well.  Her fatigue is better.  Dr. Randa Evens has turned her loose for follow up of her gastric polyps.  Her main complaint today is that her "ears are running".  See contacted Dr. Evlyn Kanner, who gave her some medicine for her sinuses/nose.  Now she feels stopped up.  She has occasional discomfort in her left axilla, but she feels no mass.  PHYSICAL EXAM: BP 162/80  Pulse 67  Temp(Src) 98 F (36.7 C) (Temporal)  Ht 5\' 2"  (1.575 m)  Wt 187 lb 3.2 oz (84.913 kg)  BMI 34.24 kg/m2  SpO2 95%  HEENT:  Pupils equal.  Dentition good.  No injury. NECK:  Supple.  No thyroid mass. LYMPH NODES:  No cervical, supraclavicular, or axillary adenopathy. BREASTS -  RIGHT:  No palpable mass or nodule.  No nipple discharge.   LEFT:   Scar at 6 o'clock position. No palpable mass or nodule.  No nipple discharge. UPPER EXTREMITIES:  No evidence of lymphedema.  DATA REVIEWED: Mammograms - 05/05/2011 were okay.  Ovidio Kin, MD, FACS Office:  [phone]

## 2011-09-28 ENCOUNTER — Ambulatory Visit
Admission: RE | Admit: 2011-09-28 | Discharge: 2011-09-28 | Disposition: A | Discharge status: Home / Self Care | Payer: Medicare Other | Source: Ambulatory Visit | Attending: Oncology | Admitting: Oncology

## 2011-09-28 DIAGNOSIS — C50919 Malignant neoplasm of unspecified site of unspecified female breast: Secondary | ICD-10-CM

## 2011-12-05 ENCOUNTER — Other Ambulatory Visit: Payer: Self-pay | Admitting: Oncology

## 2011-12-05 DIAGNOSIS — C50519 Malignant neoplasm of lower-outer quadrant of unspecified female breast: Secondary | ICD-10-CM

## 2012-01-18 ENCOUNTER — Telehealth: Payer: Self-pay | Admitting: *Deleted

## 2012-01-18 NOTE — Telephone Encounter (Signed)
moved patient appointment to 02-04-2012 starting at 11:15am left message with daughter to inform the patient of the new date and time

## 2012-01-22 ENCOUNTER — Other Ambulatory Visit: Payer: Medicare Other | Admitting: Lab

## 2012-01-22 ENCOUNTER — Ambulatory Visit: Payer: Medicare Other | Admitting: Oncology

## 2012-02-04 ENCOUNTER — Other Ambulatory Visit (HOSPITAL_BASED_OUTPATIENT_CLINIC_OR_DEPARTMENT_OTHER): Payer: Medicare Other | Admitting: Lab

## 2012-02-04 ENCOUNTER — Encounter: Payer: Self-pay | Admitting: Physician Assistant

## 2012-02-04 ENCOUNTER — Telehealth: Payer: Self-pay | Admitting: Oncology

## 2012-02-04 ENCOUNTER — Ambulatory Visit (HOSPITAL_BASED_OUTPATIENT_CLINIC_OR_DEPARTMENT_OTHER): Payer: Medicare Other | Admitting: Physician Assistant

## 2012-02-04 VITALS — BP 150/75 | HR 67 | Temp 98.1°F | Ht 62.0 in | Wt 187.6 lb

## 2012-02-04 DIAGNOSIS — R52 Pain, unspecified: Secondary | ICD-10-CM

## 2012-02-04 DIAGNOSIS — Z853 Personal history of malignant neoplasm of breast: Secondary | ICD-10-CM

## 2012-02-04 DIAGNOSIS — C50919 Malignant neoplasm of unspecified site of unspecified female breast: Secondary | ICD-10-CM

## 2012-02-04 DIAGNOSIS — Z170 Estrogen receptor positive status [ER+]: Secondary | ICD-10-CM

## 2012-02-04 DIAGNOSIS — E559 Vitamin D deficiency, unspecified: Secondary | ICD-10-CM

## 2012-02-04 LAB — CBC WITH DIFFERENTIAL/PLATELET
BASO%: 0.7 % (ref 0.0–2.0)
EOS%: 1.6 % (ref 0.0–7.0)
MCH: 26.4 pg (ref 25.1–34.0)
MCHC: 32.3 g/dL (ref 31.5–36.0)
MONO#: 0.4 10*3/uL (ref 0.1–0.9)
NEUT%: 65.9 % (ref 38.4–76.8)
RBC: 5.13 10*6/uL (ref 3.70–5.45)
WBC: 6.2 10*3/uL (ref 3.9–10.3)
lymph#: 1.6 10*3/uL (ref 0.9–3.3)
nRBC: 0 % (ref 0–0)

## 2012-02-04 NOTE — Progress Notes (Signed)
Hematology and Oncology Follow Up Visit  KENT BRAUNSCHWEIG [id number] 1935-02-24 77 y.o. 02/04/2012    HPI: Geneal is a 77 year old [place] woman with a history of an ER/PR positive T1 C. N0 left breast carcinoma for which she underwent a left lumpectomy with sentinel node dissection and 2010 followed by radiation therapy which was completed in November 2010. History of Arimidex, Aromasin, currently on letrozole 2.5 mg by mouth daily.  Interim History:   Ladora is seen today for routine follow pertain to her history of an ER positive T1 C. left breast carcinoma. She admits that she has been off letrozole for the past month due to the fact she is having significant pain with her knees and hips. She actually would like to go back on Arimidex which she initiated at the beginning of her adjuvant hormonal therapy but was discontinued due to some stiffness of her joints primarily in her right hand, but she states as far she is concerned as totally tolerable. Otherwise, no fevers, chills, night sweats, shortness of breath, or chest pain. No nausea, emesis, diarrhea or constipation issues. She'll be due for her annual mammogram at the [facility] October 2013. A detailed review of systems is otherwise noncontributory as noted below.  Review of Systems: Constitutional:  no weight loss, fever, night sweats and feels well Eyes: uses glasses ENT: no coomplaints Cardiovascular: no chest pain or dyspnea on exertion Respiratory: no cough, shortness of breath, or wheezing Neurological: no TIA or stroke symptoms Dermatological: negative Gastrointestinal: no abdominal pain, change in bowel habits, or black or bloody stools Genito-Urinary: no dysuria, trouble voiding, or hematuria Hematological and Lymphatic: negative Breast: negative Musculoskeletal: negative Remaining ROS negative.   Medications:   I have reviewed the patient's current medications.  Current Outpatient Prescriptions    Medication Sig Dispense Refill  . Ascorbic Acid (VITAMIN C) 1000 MG tablet Take 1,000 mg by mouth daily.        Marland Kitchen atenolol (TENORMIN) 50 MG tablet Take 50 mg by mouth daily.        . calcium carbonate (OS-CAL) 600 MG TABS Take 600 mg by mouth 2 (two) times daily with a meal.        . levothyroxine (SYNTHROID, LEVOTHROID) 100 MCG tablet Take 100 mcg by mouth daily.        . mometasone (NASONEX) 50 MCG/ACT nasal spray Place 2 sprays into the nose daily.        . Multiple Vitamins-Minerals (ICAPS PO) Take by mouth 2 (two) times daily.        Marland Kitchen omeprazole (PRILOSEC) 20 MG capsule Take 40 mg by mouth daily.        . cetirizine (ZYRTEC) 5 MG tablet Take 5 mg by mouth daily.        . fish oil-omega-3 fatty acids 1000 MG capsule Take 2 g by mouth daily.        Marland Kitchen letrozole (FEMARA) 2.5 MG tablet TAKE 1 TABLET BY MOUTH EVERY DAY  30 tablet  1  . rosuvastatin (CRESTOR) 10 MG tablet Take 10 mg by mouth once a week.        . Vitamin D, Ergocalciferol, (DRISDOL) 50000 UNITS CAPS Take 50,000 Units by mouth every 7 (seven) days.          Allergies:  Allergies  Allergen Reactions  . Exemestane Itching  . Penicillins Itching    Physical Exam: Filed Vitals:   02/04/12 1125  BP: 150/75  Pulse: 67  Temp: 98.1  F (36.7 C)    Body mass index is 34.31 kg/(m^2). Weight: 187 lbs. HEENT:  Sclerae anicteric, conjunctivae pink.  Oropharynx clear.  No mucositis or candidiasis.   Nodes:  No cervical, supraclavicular, or axillary lymphadenopathy palpated.  Breast Exam:  Right breast is free of masses, skin changes, nipple inversion or discharge, axilla is clear.  The left breast lumpectomy scar is benign without any dominant  masses nipple inversion or discharge. Axilla is clear. Lungs:  Clear to auscultation bilaterally.  No crackles, rhonchi, or wheezes.   Heart:  Regular rate and rhythm.   Abdomen:  Soft, nontender.  Positive bowel sounds.  No organomegaly or masses palpated.   Musculoskeletal:  No focal  spinal tenderness to palpation.  Extremities:  Benign.  No peripheral edema or cyanosis.   Skin:  Benign.   Neuro:  Nonfocal, alert and oriented x 3.   Lab Results: Lab Results  Component Value Date   WBC 6.2 02/04/2012   HGB 13.5 02/04/2012   HCT 41.8 02/04/2012   MCV 81.6 02/04/2012   PLT 147 02/04/2012   NEUTROABS 4.1 02/04/2012     Chemistry      Component Value Date/Time   NA 137 08/24/2011 0920   K 4.6 08/24/2011 0920   CL 100 08/24/2011 0920   CO2 26 08/24/2011 0920   BUN 19 08/24/2011 0920   CREATININE 0.77 08/24/2011 0920      Component Value Date/Time   CALCIUM 9.5 08/24/2011 0920   ALKPHOS 71 08/24/2011 0920   AST 47* 08/24/2011 0920   ALT 46* 08/24/2011 0920   BILITOT 0.8 08/24/2011 0920      Lab Results  Component Value Date   LABCA2 15 08/24/2011    Assessment:  Chrystle is a 77 year old [place] woman with a history of an ER/PR positive T1 C. N0 left breast carcinoma for which she underwent a left lumpectomy with sentinel node dissection and 2010 followed by radiation therapy which was completed in November 2010. History of Arimidex, Aromasin, currently on letrozole 2.5 mg by mouth daily.  No evidence of recurrent or metastatic disease.  Case to be reviewed with Dr. Pierce Crane.   Plan:  I have provided [place] a prescription for Arimidex 1 mg by mouth daily which she will resume. She'll return to see [place] in 6 months time, prior to which her annual diagnostic mammogram will be obtained in October of 2013. This plan was reviewed with the patient, who voices understanding and agreement.  She knows to call with any changes or problems.    Shari Natt T, PA-C 02/04/2012

## 2012-02-04 NOTE — Telephone Encounter (Signed)
gve the pt her nov 2013 appt calendar along with the mammo appt 

## 2012-02-05 LAB — COMPREHENSIVE METABOLIC PANEL
ALT: 30 U/L (ref 0–35)
AST: 39 U/L — ABNORMAL HIGH (ref 0–37)
Albumin: 4.3 g/dL (ref 3.5–5.2)
Alkaline Phosphatase: 88 U/L (ref 39–117)
BUN: 14 mg/dL (ref 6–23)
Calcium: 9.3 mg/dL (ref 8.4–10.5)
Chloride: 99 mEq/L (ref 96–112)
Creatinine, Ser: 0.65 mg/dL (ref 0.50–1.10)
Potassium: 3.9 mEq/L (ref 3.5–5.3)

## 2012-05-06 ENCOUNTER — Encounter (INDEPENDENT_AMBULATORY_CARE_PROVIDER_SITE_OTHER): Payer: Self-pay | Admitting: Surgery

## 2012-05-06 ENCOUNTER — Ambulatory Visit (INDEPENDENT_AMBULATORY_CARE_PROVIDER_SITE_OTHER): Payer: Medicare Other | Admitting: Surgery

## 2012-05-06 VITALS — BP 122/78 | HR 72 | Temp 97.6°F | Resp 16 | Ht 62.5 in | Wt 186.2 lb

## 2012-05-06 DIAGNOSIS — Z853 Personal history of malignant neoplasm of breast: Secondary | ICD-10-CM

## 2012-05-06 NOTE — Progress Notes (Signed)
[facility]  Ovidio Kin, MD,  FACS [address] [place]  [address] [place], [place]    [address] Phone:  [phone] FAX:  [phone]   Re:   [name] DOB:   1935/08/20 MRN:   [id number]  ASSESSMENT AND PLAN: 1.  T1c, N0, left breast cancer  Lumpectomy - 06/04/2009  Followed by Drs Donnie Coffin and Mitzi Hansen.  On Arimidex.  She has tried 3 aromatase inhibitors, but has had trouble with them.  She is disease free and will see me in 6 months.  2.  Chronic fatigue symptoms, better. 3.  History of gastric polyps.  No further follow up planned. 4.  Uterine polyp removed by Dr. Ronnell Freshwater, Dec. 2011. 5.  Ears running.  HISTORY OF PRESENT ILLNESS: Chief Complaint  Patient presents with  . Breast Cancer Long Term Follow Up    [name] is a 77 y.o. (DOB: Jul 29, 1935)  white female who is a patient of Julian Hy, MD and comes to me today for follow up of left breast cancer.  Doing well.  Her sinuses are starting to act up.  They act up seasonally.  She has no concern or change in her self exam.  She has occasional discomfort in her left axilla, but she feels no mass.  PHYSICAL EXAM: BP 122/78  Pulse 72  Temp 97.6 F (36.4 C) (Temporal)  Resp 16  Ht 5' 2.5" (1.588 m)  Wt 186 lb 4 oz (84.482 kg)  BMI 33.52 kg/m2  HEENT:  Pupils equal.  Dentition good.  NECK:  Supple.  No thyroid mass. LYMPH NODES:  No cervical, supraclavicular, or axillary adenopathy. BREASTS -  RIGHT:  No palpable mass or nodule.  No nipple discharge.   LEFT:   Scar at 6 o'clock position with some dimpling. No palpable mass or nodule.  No nipple discharge. UPPER EXTREMITIES:  No evidence of lymphedema.  DATA REVIEWED: Mammograms - 06/24/2011 were okay. Up for her next mammogram in Oct.  Ovidio Kin, MD, FACS Office:  [phone]

## 2012-07-07 ENCOUNTER — Ambulatory Visit
Admission: RE | Admit: 2012-07-07 | Discharge: 2012-07-07 | Disposition: A | Discharge status: Home / Self Care | Payer: Medicare Other | Source: Ambulatory Visit | Attending: Physician Assistant | Admitting: Physician Assistant

## 2012-07-07 DIAGNOSIS — Z853 Personal history of malignant neoplasm of breast: Secondary | ICD-10-CM

## 2012-07-29 ENCOUNTER — Ambulatory Visit (HOSPITAL_BASED_OUTPATIENT_CLINIC_OR_DEPARTMENT_OTHER): Payer: Medicare Other | Admitting: Oncology

## 2012-07-29 ENCOUNTER — Other Ambulatory Visit (HOSPITAL_BASED_OUTPATIENT_CLINIC_OR_DEPARTMENT_OTHER): Payer: Medicare Other | Admitting: Lab

## 2012-07-29 ENCOUNTER — Telehealth: Payer: Self-pay | Admitting: *Deleted

## 2012-07-29 VITALS — BP 155/80 | HR 85 | Temp 98.4°F | Resp 20 | Ht 62.5 in | Wt 187.6 lb

## 2012-07-29 DIAGNOSIS — Z853 Personal history of malignant neoplasm of breast: Secondary | ICD-10-CM

## 2012-07-29 DIAGNOSIS — L659 Nonscarring hair loss, unspecified: Secondary | ICD-10-CM

## 2012-07-29 DIAGNOSIS — M949 Disorder of cartilage, unspecified: Secondary | ICD-10-CM

## 2012-07-29 DIAGNOSIS — C50919 Malignant neoplasm of unspecified site of unspecified female breast: Secondary | ICD-10-CM

## 2012-07-29 DIAGNOSIS — M899 Disorder of bone, unspecified: Secondary | ICD-10-CM

## 2012-07-29 LAB — COMPREHENSIVE METABOLIC PANEL (CC13)
Alkaline Phosphatase: 93 U/L (ref 40–150)
CO2: 29 mEq/L (ref 22–29)
Creatinine: 0.8 mg/dL (ref 0.6–1.1)
Glucose: 113 mg/dl — ABNORMAL HIGH (ref 70–99)
Total Bilirubin: 1.14 mg/dL (ref 0.20–1.20)

## 2012-07-29 LAB — CBC WITH DIFFERENTIAL/PLATELET
Basophils Absolute: 0 10*3/uL (ref 0.0–0.1)
EOS%: 2.2 % (ref 0.0–7.0)
Eosinophils Absolute: 0.1 10*3/uL (ref 0.0–0.5)
LYMPH%: 25.7 % (ref 14.0–49.7)
MCH: 27.8 pg (ref 25.1–34.0)
MCV: 83.2 fL (ref 79.5–101.0)
MONO%: 7.8 % (ref 0.0–14.0)
NEUT#: 4.1 10*3/uL (ref 1.5–6.5)
Platelets: 144 10*3/uL — ABNORMAL LOW (ref 145–400)
RBC: 4.92 10*6/uL (ref 3.70–5.45)
RDW: 15.7 % — ABNORMAL HIGH (ref 11.2–14.5)

## 2012-07-29 LAB — LACTATE DEHYDROGENASE (CC13): LDH: 205 U/L (ref 125–245)

## 2012-07-29 LAB — CANCER ANTIGEN 27.29: CA 27.29: 22 U/mL (ref 0–39)

## 2012-07-29 NOTE — Progress Notes (Signed)
Hematology and Oncology Follow Up Visit  [name] [id number] 1935-07-09 77 y.o. 07/29/2012 10:24 AM   Principle Diagnosis: T1cNo breast cancer s/p lumpectomy 2010, s/p xrt 07/2009, now on letrozole Hx of colonic polyps,  Currently on arimidex.   Interim History:  Pt is doing well, no current problems with letrozole. Last mamm 10/13- wnl, She's noticed some hair loss and stop taking atenolol thinking that maybe the cause of the realistically this may be more related to her Arimidex. She is having occasional hot flashes. She is tolerating Arimidex well. Most recent bone density that shows mild osteopenia which is stable.  Medications: I have reviewed the patient's current medications.  Allergies:  Allergies  Allergen Reactions  . Sulfa Antibiotics Hives, Itching and Rash  . Exemestane Itching  . Penicillins Itching    Past Medical History, Surgical history, Social history, and Family History were reviewed and updated.  Review of Systems: Constitutional:  Negative for fever, chills, night sweats, anorexia, weight loss, pain. Cardiovascular: no chest pain or dyspnea on exertion Respiratory: no cough, shortness of breath, or wheezing Neurological: no TIA or stroke symptoms Dermatological: negative ENT: negative Skin Gastrointestinal: no abdominal pain, change in bowel habits, or black or bloody stools Genito-Urinary: no dysuria, trouble voiding, or hematuria Hematological and Lymphatic: negative Breast: negative for breast lumps Musculoskeletal: negative Remaining ROS negative.  Physical Exam: Blood pressure 155/80, pulse 85, temperature 98.4 F (36.9 C), resp. rate 20, height 5' 2.5" (1.588 m), weight 187 lb 9.6 oz (85.095 kg). ECOG:  General appearance: alert, cooperative and appears stated age Head: Normocephalic, without obvious abnormality, atraumatic Neck: no adenopathy, no carotid bruit, no JVD, supple, symmetrical, trachea midline and thyroid not enlarged,  symmetric, no tenderness/mass/nodules Lymph nodes: Cervical, supraclavicular, and axillary nodes normal. Cardiac : normal Pulmonary:normal Breasts:rt and lt w/out masses, left breast is status post lumpectomy and smaller than the right. There is an old tenderness and slight firmness from nipple areola complex with her scar. This is healed well. Abdomen: normal Extremities normal Neuro:normal  Lab Results: Lab Results  Component Value Date   WBC 6.4 07/29/2012   HGB 13.7 07/29/2012   HCT 40.9 07/29/2012   MCV 83.2 07/29/2012   PLT 144* 07/29/2012     Chemistry      Component Value Date/Time   NA 135 02/04/2012 1108   K 3.9 02/04/2012 1108   CL 99 02/04/2012 1108   CO2 28 02/04/2012 1108   BUN 14 02/04/2012 1108   CREATININE 0.65 02/04/2012 1108      Component Value Date/Time   CALCIUM 9.3 02/04/2012 1108   ALKPHOS 88 02/04/2012 1108   AST 39* 02/04/2012 1108   ALT 30 02/04/2012 1108   BILITOT 0.9 02/04/2012 1108       Radiological Studies: chest X-ray n/a Mammogram 10/13-nl    Bone  density pending  Impression and Plan: Pt is doing well, tolerating l Arimidex e. i have encouraged exercise.F/u 6 months She'll let us know if her hair loss continues which point she wanted discontinue taking Arimidex and switch to something else. I will see her in 6 months as noted. Will have repeat imaging studies and labs.  More than 50% of the visit was spent in patient-related counselling   Pierce Crane, MD 11/8/201310:24 AM

## 2012-07-29 NOTE — Telephone Encounter (Signed)
Gave patient appointment for 01-2013 

## 2012-11-29 ENCOUNTER — Telehealth: Payer: Self-pay | Admitting: *Deleted

## 2012-11-29 NOTE — Telephone Encounter (Signed)
Called and spoke with patient to reschedule her appt. Confirmed appt. For 01/27/13 at 0900 with Bernell List.  Then will become Dr. Welton Flakes.

## 2012-12-07 ENCOUNTER — Encounter (INDEPENDENT_AMBULATORY_CARE_PROVIDER_SITE_OTHER): Payer: Self-pay | Admitting: Surgery

## 2012-12-07 ENCOUNTER — Ambulatory Visit (INDEPENDENT_AMBULATORY_CARE_PROVIDER_SITE_OTHER): Payer: Medicare Other | Admitting: Surgery

## 2012-12-07 VITALS — BP 132/80 | HR 90 | Temp 98.2°F | Resp 18 | Ht 62.0 in | Wt 187.0 lb

## 2012-12-07 DIAGNOSIS — Z853 Personal history of malignant neoplasm of breast: Secondary | ICD-10-CM

## 2012-12-07 NOTE — Progress Notes (Addendum)
[facility]  Ovidio Kin, MD,  FACS [address] [place]  [address] [place], [place]    [address] Phone:  [phone] FAX:  [phone]   Re:   [name] DOB:   [birth year] MRN:   [id number]  ASSESSMENT AND PLAN: 1.  T1c, N0, left breast cancer  Lumpectomy - 06/04/2009  1.5 cm IDC, ER - 100%, PR - 13%, Ki67 - 14%, Her2Neu - negative, 0/2 nodes  Followed by Drs Donnie Coffin - to see Dr. Welton Flakes - and [facility].  On Arimidex.  She tried 3 aromatase inhibitors, but has had trouble with them.  She is disease free and will see me in 6 months.  2.  Chronic fatigue symptoms, better. 3.  History of gastric polyps.  No further follow up planned. 4.  Uterine polyp removed by Dr. Ronnell Freshwater, Dec. 2011. 5.  2 cm faint red lesion at left wrist  She recently scratched off a lesion.  I think this is benign and will heal.  If persistent beyond 10 weeks, could consider biopsy. 6.  Started on Losartin and has "dry" mouth  HISTORY OF PRESENT ILLNESS: Chief Complaint  Patient presents with  . Breast Cancer Long Term Follow Up    [name] is a 78 y.o. (DOB: [birth year])  white female who is a patient of Julian Hy, MD and comes to me today for follow up of left breast cancer.  Doing well.  She has no concern or change in her self exam.  She has occasional discomfort in her left axilla, but she feels no mass.  The Arimidex bothers her joints, so she is counting the days until she can stop it.  She is to see Dr. Welton Flakes to follow her from an oncology standpoint. She asked about Losartan causing a "dry" mouth and loss of taste.  She switched BP meds to Losartan because she was loosing hair.  I suggested lemon juice to see if it increases her saliva.  From my review of Losartan, dry mouth is not on the side effects list.  Social History: Her granddaughter gets married in [place] in Feb 05, 2013.  She has another granddaughter who is a resident in [place], [place].   PHYSICAL  EXAM: BP 132/80  Pulse 90  Temp(Src) 98.2 F (36.8 C)  Resp 18  Ht 5\' 2"  (1.575 m)  Wt 187 lb (84.823 kg)  BMI 34.19 kg/m2  HEENT:  Pupils equal.  Dentition good.  NECK:  Supple.  No thyroid mass. LYMPH NODES:  No cervical, supraclavicular, or axillary adenopathy. BREASTS -  RIGHT:  No palpable mass or nodule.  No nipple discharge.   LEFT:   Scar at 6 o'clock position with some dimpling. No palpable mass or nodule.  No nipple discharge. UPPER EXTREMITIES:  No evidence of lymphedema.  Above her left wrist is a pale pink lesion, about 2 cm, where she said she picked a lesion off.  I think this is just irritated skin and should heal.  We talked about biopsy if it was persistent (>10 weeks)  DATA REVIEWED: Mammograms, [facility] - 07/07/2012 were okay.   Ovidio Kin, MD, FACS Office:  [phone]

## 2013-01-27 ENCOUNTER — Other Ambulatory Visit: Payer: Medicare Other | Admitting: Lab

## 2013-01-27 ENCOUNTER — Ambulatory Visit (HOSPITAL_BASED_OUTPATIENT_CLINIC_OR_DEPARTMENT_OTHER): Payer: Medicare Other | Admitting: Family

## 2013-01-27 ENCOUNTER — Telehealth: Payer: Self-pay | Admitting: *Deleted

## 2013-01-27 ENCOUNTER — Encounter: Payer: Self-pay | Admitting: Family

## 2013-01-27 ENCOUNTER — Ambulatory Visit: Payer: Medicare Other | Admitting: Oncology

## 2013-01-27 ENCOUNTER — Ambulatory Visit (HOSPITAL_BASED_OUTPATIENT_CLINIC_OR_DEPARTMENT_OTHER): Payer: Medicare Other | Admitting: Lab

## 2013-01-27 VITALS — BP 139/82 | HR 85 | Temp 98.1°F | Resp 20 | Ht 62.0 in | Wt 187.3 lb

## 2013-01-27 DIAGNOSIS — C50919 Malignant neoplasm of unspecified site of unspecified female breast: Secondary | ICD-10-CM

## 2013-01-27 DIAGNOSIS — R61 Generalized hyperhidrosis: Secondary | ICD-10-CM

## 2013-01-27 DIAGNOSIS — M949 Disorder of cartilage, unspecified: Secondary | ICD-10-CM

## 2013-01-27 DIAGNOSIS — C50912 Malignant neoplasm of unspecified site of left female breast: Secondary | ICD-10-CM

## 2013-01-27 DIAGNOSIS — Z853 Personal history of malignant neoplasm of breast: Secondary | ICD-10-CM

## 2013-01-27 DIAGNOSIS — N959 Unspecified menopausal and perimenopausal disorder: Secondary | ICD-10-CM

## 2013-01-27 LAB — COMPREHENSIVE METABOLIC PANEL (CC13)
ALT: 21 U/L (ref 0–55)
Alkaline Phosphatase: 83 U/L (ref 40–150)
CO2: 27 mEq/L (ref 22–29)
Creatinine: 0.8 mg/dL (ref 0.6–1.1)
Glucose: 113 mg/dl — ABNORMAL HIGH (ref 70–99)
Sodium: 140 mEq/L (ref 136–145)
Total Bilirubin: 1.01 mg/dL (ref 0.20–1.20)
Total Protein: 8 g/dL (ref 6.4–8.3)

## 2013-01-27 LAB — LACTATE DEHYDROGENASE (CC13): LDH: 179 U/L (ref 125–245)

## 2013-01-27 LAB — CBC WITH DIFFERENTIAL/PLATELET
EOS%: 1.9 % (ref 0.0–7.0)
Eosinophils Absolute: 0.1 10*3/uL (ref 0.0–0.5)
LYMPH%: 26.6 % (ref 14.0–49.7)
MCH: 27.7 pg (ref 25.1–34.0)
MCV: 84 fL (ref 79.5–101.0)
MONO%: 7 % (ref 0.0–14.0)
Platelets: 165 10*3/uL (ref 145–400)
RBC: 4.85 10*6/uL (ref 3.70–5.45)
RDW: 15 % — ABNORMAL HIGH (ref 11.2–14.5)

## 2013-01-27 NOTE — Patient Instructions (Addendum)
Please contact us at [phone] if you have any questions or concerns.  Please continue to do well and enjoy life!!!  Get plenty of rest, drink plenty of water, exercise daily, eat a balanced diet.  Continue taking calcium and vitamin D3 daily.

## 2013-01-27 NOTE — Progress Notes (Signed)
[facility]  [phone] [phone]  OFFICE PROGRESS NOTE  PATIENT: [name]   DOB: [birth year]  MR#: [id number]  JYN#:[id number]   QM:VHQIO,NGEXBMW Hessie Diener, MD Ovidio Kin, M.D. Dorothy Puffer, M.D.  DIAGNOSIS:  A 78 year old [place], [place] woman with a history of invasive ductal carcinoma and DCIS of the left breast diagnosed in 04/2009.   PRIOR THERAPY: 1.  Status post left breast needle core biopsy at the 6 o'clock position on 05/10/2009 which showed invasive mammary carcinoma, ER 100%, PR 13%, Ki-67 14%, HER2/neu negative with a ratio of 1.38.  2.  Status post left breast needle/wire localized lumpectomy with sentinel node biopsy on 06/04/2009 for a stage I, pT1c pN0, 1.5 cm invasive ductal carcinoma and DCIS, grade 2, ER 100%, PR 13%, Ki-6714%, HER2/neu negative with a ratio of 1.38, 0/2 positive lymph nodes.  3.  Status post radiation therapy from 07/22/2009 through 08/20/2009.  4.  The patient started antiestrogen therapy with Arimidex in 11/2009.  Arimidex was discontinued due to patient  intolerance.  The patient was then started on antiestrogen therapy Femara.  Femara caused her to have aching joints and was discontinued.  The patient then tried antiestrogen therapy with Aromasin which caused her severe pruritus and was discontinued.  The patient then went back to antiestrogen therapy with Arimidex.  CURRENT THERAPY:  Arimidex 1 mg PO daily.   INTERVAL HISTORY: Dr. Welton Flakes and I saw [name] today for follow up of left breast invasive ductal carcinoma.  She is accompanied by her niece [name] that is 36 years old.  Her last office visit with Dr. Donnie Coffin was on 07/29/2012 .  Since her last office visit the patient reports that she has ongoing bilateral hand pain, left arm and left breast pain, hot flashes and night sweats.  The patient denies any other symptomatology.  The patient is establishing herself with Dr. Milta Deiters service  today.  PAST MEDICAL HISTORY: Past Medical History  Diagnosis Date  . Arthritis   . Cough   . Nasal congestion   . Arthritis pain   . Cancer 04/2009    Left breast cancer  . Hypertension   . Hyperlipidemia   . GERD (gastroesophageal reflux disease)   . Sinusitis   . Thyroid disease   . Sleep apnea   . Cryptosporidial gastroenteritis     PAST SURGICAL HISTORY: Past Surgical History  Procedure Laterality Date  . Tonsillectomy    . Cholecystectomy    . Rotator cuff repair    . Carpal tunnel release    . Stomach polyps    . Breast surgery    . Dilation and curettage of uterus    . Breast lumpectomy w/ needle localization Left 06/04/2009    FAMILY HISTORY: Family History  Problem Relation Age of Onset  . Diabetes Father   . Heart disease Father   . Heart attack Father   . Diabetes Sister   . Diabetes Brother     SOCIAL HISTORY: History  Substance Use Topics  . Smoking status: Never Smoker   . Smokeless tobacco: Never Used  . Alcohol Use: No    ALLERGIES: Allergies  Allergen Reactions  . Sulfa Antibiotics Hives, Itching and Rash  . Doryx (Doxycycline) Swelling  . Exemestane Itching  . Femara (Letrozole) Other (See Comments)    Aching joints   . Penicillins Itching  . Suprax (Cefixime) Itching     MEDICATIONS:  Current Outpatient Prescriptions  Medication Sig Dispense Refill  .  anastrozole (ARIMIDEX) 1 MG tablet Take 1 mg by mouth daily.       . Ascorbic Acid (VITAMIN C) 1000 MG tablet Take 1,000 mg by mouth daily.        . B Complex-C (SUPER B COMPLEX/VITAMIN C PO) Take 1 tablet by mouth daily.       . Calcium Carbonate-Vit D-Min (CALCIUM 1200 PO) Take 1 tablet by mouth daily.      . cetirizine (ZYRTEC) 10 MG tablet Take 10 mg by mouth daily as needed.       . Cholecalciferol (VITAMIN D) 2000 UNITS tablet Take 2,000 Units by mouth daily.      Marland Kitchen levothyroxine (SYNTHROID, LEVOTHROID) 100 MCG tablet Take 100 mcg by mouth daily.        Marland Kitchen  losartan-hydrochlorothiazide (HYZAAR) 100-12.5 MG per tablet Take 1 tablet by mouth daily.       . Magnesium 250 MG TABS Take by mouth daily.      . Multiple Vitamins-Minerals (ICAPS MV PO) Take by mouth 2 (two) times daily.      . naproxen (NAPROSYN) 500 MG tablet Take 500 mg by mouth as needed.       . Omega-3 Fatty Acids (FISH OIL) 1200 MG CAPS Take 4,800 mg by mouth 3 (three) times daily.      Marland Kitchen omeprazole (PRILOSEC) 20 MG capsule Take 20 mg by mouth 2 (two) times daily.       . VOLTAREN 1 % GEL Apply 2 g topically 4 (four) times daily.        No current facility-administered medications for this visit.      REVIEW OF SYSTEMS: A 10 point review of systems was completed and is negative except as noted above.    PHYSICAL EXAMINATION: BP 139/82  Pulse 85  Temp(Src) 98.1 F (36.7 C) (Oral)  Resp 20  Ht 5\' 2"  (1.575 m)  Wt 187 lb 4.8 oz (84.959 kg)  BMI 34.25 kg/m2   General appearance: Alert, cooperative, well nourished, no apparent distress Head: Normocephalic, without obvious abnormality, atraumatic Eyes: Arcus senilis, PERRLA, EOMI, right eye weeping Nose: Nares, septum and mucosa are normal, no drainage or sinus tenderness Neck: No adenopathy, supple, symmetrical, trachea midline, goiter, no tenderness Resp: Clear to auscultation bilaterally Cardio: Regular rate and rhythm, S1, S2 normal, no murmur, click, rub or gallop Breasts: left breast architectural and radiation changes noted, breasts breast has well-healed surgical scars, bilateral for medial mammary ridge, left axillary fullness, no nipple inversion GI: Soft, distended, non-tender, hypoactive bowel sounds, no organomegaly Extremities: Extremities normal, atraumatic, no cyanosis or edema Lymph nodes: Cervical, supraclavicular, and axillary nodes normal Neurologic: Grossly normal    ECOG FS:  Grade 1 - Symptomatic but completely ambulatory          LAB RESULTS: Lab Results  Component Value Date   WBC 6.4  01/27/2013   NEUTROABS 4.1 01/27/2013   HGB 13.4 01/27/2013   HCT 40.7 01/27/2013   MCV 84.0 01/27/2013   PLT 165 01/27/2013      Chemistry      Component Value Date/Time   NA 140 01/27/2013 1057   NA 135 02/04/2012 1108   K 4.4 01/27/2013 1057   K 3.9 02/04/2012 1108   CL 101 01/27/2013 1057   CL 99 02/04/2012 1108   CO2 27 01/27/2013 1057   CO2 28 02/04/2012 1108   BUN 17.9 01/27/2013 1057   BUN 14 02/04/2012 1108   CREATININE 0.8 01/27/2013 1057   CREATININE  0.65 02/04/2012 1108      Component Value Date/Time   CALCIUM 10.2 01/27/2013 1057   CALCIUM 9.3 02/04/2012 1108   ALKPHOS 83 01/27/2013 1057   ALKPHOS 88 02/04/2012 1108   AST 25 01/27/2013 1057   AST 39* 02/04/2012 1108   ALT 21 01/27/2013 1057   ALT 30 02/04/2012 1108   BILITOT 1.01 01/27/2013 1057   BILITOT 0.9 02/04/2012 1108       Lab Results  Component Value Date   LABCA2 22 07/29/2012     RADIOGRAPHIC STUDIES: No results found.  ASSESSMENT: 78 y.[place], [place]: 1.  Status post left breast needle core biopsy at the 6 o'clock position on 05/10/2009 which showed invasive mammary carcinoma, ER 100%, PR 13%, Ki-67 14%, HER2/neu negative with a ratio of 1.38.  2.  Status post left breast needle/wire localized lumpectomy with sentinel node biopsy on 06/04/2009 for a stage I, pT1c pN0, 1.5 cm invasive ductal carcinoma and DCIS, grade 2, ER 100%, PR 13%, Ki-6714%, HER2/neu negative with a ratio of 1.38, 0/2 positive lymph nodes.  3.  Status post radiation therapy from 07/22/2009 through 08/20/2009.  4.  The patient started antiestrogen therapy with Arimidex in 11/2009.  Arimidex was discontinued due to patient  intolerance.  The patient was then started on antiestrogen therapy Femara.  Femara caused her to have aching joints and was discontinued.  The patient then tried antiestrogen therapy with Aromasin which caused her severe pruritus and was discontinued.  The patient then went back to antiestrogen therapy with Arimidex.  5.   Osteopenia   6.  Hot flashes/night sweats  PLAN: 1.  The patient will continue antiestrogen therapy with Arimidex 1 mg PO daily until 11/2014 as tolerated.  Her last bilateral digital diagnostic mammogram was on 07/07/2012 and showed benign findings.  The patient is due for her annual mammogram in 06/2013, and we will schedule this for her.  2.  The patient's last bone density scan on 09/28/2011 showed a T score of -1.6 (osteopenia). The patient currently takes calcium 1200 mg PO daily in addition to vitamin D 2000 IU's PO daily.  Her next bone density scan will be due in 09/2013.  3. The patient states that her hot flashes and night sweats are tolerable.  4.  We plan to see the patient again in 6 months at which time we will check the CBC, CMP, LDH, and vitamin D level.  All questions were answered.  The patient was encouraged to contact us in the interim with any problems, questions or concerns.    Larina Bras, NP-C 01/29/2013, 12:42 AM

## 2013-01-27 NOTE — Telephone Encounter (Signed)
appts made and printed...td 

## 2013-01-28 LAB — VITAMIN D 25 HYDROXY (VIT D DEFICIENCY, FRACTURES): Vit D, 25-Hydroxy: 52 ng/mL (ref 30–89)

## 2013-01-30 ENCOUNTER — Telehealth: Payer: Self-pay

## 2013-01-30 NOTE — Telephone Encounter (Signed)
Spoke with pt regarding labs for 5/9. Per [facility], labs are ok. Pt voiced understanding and knows to call the office with any questions. TMB

## 2013-02-18 ENCOUNTER — Other Ambulatory Visit: Payer: Self-pay | Admitting: Oncology

## 2013-02-18 DIAGNOSIS — C50912 Malignant neoplasm of unspecified site of left female breast: Secondary | ICD-10-CM

## 2013-07-10 ENCOUNTER — Ambulatory Visit
Admission: RE | Admit: 2013-07-10 | Discharge: 2013-07-10 | Disposition: A | Discharge status: Home / Self Care | Payer: Medicare Other | Source: Ambulatory Visit | Attending: Family | Admitting: Family

## 2013-07-10 DIAGNOSIS — Z853 Personal history of malignant neoplasm of breast: Secondary | ICD-10-CM

## 2013-08-01 ENCOUNTER — Other Ambulatory Visit: Payer: Self-pay

## 2013-08-01 DIAGNOSIS — Z853 Personal history of malignant neoplasm of breast: Secondary | ICD-10-CM

## 2013-08-01 DIAGNOSIS — C50912 Malignant neoplasm of unspecified site of left female breast: Secondary | ICD-10-CM

## 2013-08-01 MED ORDER — ANASTROZOLE 1 MG PO TABS: 1.0000 mg | ORAL_TABLET | Freq: Every day | ORAL | Status: DC

## 2013-08-01 NOTE — Telephone Encounter (Signed)
E-prescribed in another encounter.  Put in Refill encounter to be able to track refills. That is why it says no print

## 2013-08-03 ENCOUNTER — Ambulatory Visit (HOSPITAL_BASED_OUTPATIENT_CLINIC_OR_DEPARTMENT_OTHER): Payer: Medicare Other | Admitting: Oncology

## 2013-08-03 ENCOUNTER — Encounter: Payer: Self-pay | Admitting: Oncology

## 2013-08-03 ENCOUNTER — Other Ambulatory Visit (HOSPITAL_BASED_OUTPATIENT_CLINIC_OR_DEPARTMENT_OTHER): Payer: Medicare Other | Admitting: Lab

## 2013-08-03 ENCOUNTER — Telehealth: Payer: Self-pay | Admitting: *Deleted

## 2013-08-03 VITALS — BP 126/76 | HR 85 | Temp 98.7°F | Resp 18 | Ht 62.0 in | Wt 192.9 lb

## 2013-08-03 DIAGNOSIS — Z853 Personal history of malignant neoplasm of breast: Secondary | ICD-10-CM

## 2013-08-03 DIAGNOSIS — C50919 Malignant neoplasm of unspecified site of unspecified female breast: Secondary | ICD-10-CM

## 2013-08-03 DIAGNOSIS — M899 Disorder of bone, unspecified: Secondary | ICD-10-CM

## 2013-08-03 DIAGNOSIS — Z170 Estrogen receptor positive status [ER+]: Secondary | ICD-10-CM

## 2013-08-03 LAB — CBC WITH DIFFERENTIAL/PLATELET
BASO%: 0.7 % (ref 0.0–2.0)
Basophils Absolute: 0 10*3/uL (ref 0.0–0.1)
EOS%: 2.1 % (ref 0.0–7.0)
HCT: 38.6 % (ref 34.8–46.6)
LYMPH%: 24.4 % (ref 14.0–49.7)
MCH: 27.1 pg (ref 25.1–34.0)
MCHC: 32.3 g/dL (ref 31.5–36.0)
MCV: 84.1 fL (ref 79.5–101.0)
MONO#: 0.4 10*3/uL (ref 0.1–0.9)
NEUT%: 66.5 % (ref 38.4–76.8)
Platelets: 160 10*3/uL (ref 145–400)
RBC: 4.59 10*6/uL (ref 3.70–5.45)
WBC: 6.8 10*3/uL (ref 3.9–10.3)

## 2013-08-03 LAB — COMPREHENSIVE METABOLIC PANEL (CC13)
AST: 25 U/L (ref 5–34)
Alkaline Phosphatase: 90 U/L (ref 40–150)
Anion Gap: 11 mEq/L (ref 3–11)
BUN: 16.7 mg/dL (ref 7.0–26.0)
Calcium: 9.9 mg/dL (ref 8.4–10.4)
Chloride: 102 mEq/L (ref 98–109)
Creatinine: 0.8 mg/dL (ref 0.6–1.1)

## 2013-08-03 NOTE — Progress Notes (Signed)
[facility]  [phone] Fax:([phone]  OFFICE PROGRESS NOTE  PATIENT: [name]   DOB: 10-23-1934  MR#: [id number]  UUV#:[id number]   [id number],GLOVFIE Hessie Diener, MD Ovidio Kin, M.D. Dorothy Puffer, M.D.  DIAGNOSIS:  A 78 year old [place], [place] woman with a history of invasive ductal carcinoma and DCIS of the left breast diagnosed in 04/2009.   PRIOR THERAPY: 1.  Status post left breast needle core biopsy at the 6 o'clock position on 05/10/2009 which showed invasive mammary carcinoma, ER 100%, PR 13%, Ki-67 14%, HER2/neu negative with a ratio of 1.38.  2.  Status post left breast needle/wire localized lumpectomy with sentinel node biopsy on 06/04/2009 for a stage I, pT1c pN0, 1.5 cm invasive ductal carcinoma and DCIS, grade 2, ER 100%, PR 13%, Ki-6714%, HER2/neu negative with a ratio of 1.38, 0/2 positive lymph nodes.  3.  Status post radiation therapy from 07/22/2009 through 08/20/2009.  4.  The patient started antiestrogen therapy with Arimidex in 11/2009.  Arimidex was discontinued due to patient  intolerance.  The patient was then started on antiestrogen therapy Femara.  Femara caused her to have aching joints and was discontinued.  The patient then tried antiestrogen therapy with Aromasin which caused her severe pruritus and was discontinued.  The patient then went back to antiestrogen therapy with Arimidex.  CURRENT THERAPY:  Arimidex 1 mg PO daily.   INTERVAL HISTORY:  [name] is seen today for follow up of left breast invasive ductal carcinoma.   Since her last office visit the patient reports that she has ongoing bilateral hand pain, left arm and left breast pain, hot flashes and night sweats.  The patient denies any other symptomatology. Remainder of the tampon review of systems is negative  PAST MEDICAL HISTORY: Past Medical History  Diagnosis Date  . Arthritis   . Cough   . Nasal congestion   . Arthritis pain   . Cancer  04/2009    Left breast cancer  . Hypertension   . Hyperlipidemia   . GERD (gastroesophageal reflux disease)   . Sinusitis   . Thyroid disease   . Sleep apnea   . Cryptosporidial gastroenteritis     PAST SURGICAL HISTORY: Past Surgical History  Procedure Laterality Date  . Tonsillectomy    . Cholecystectomy    . Rotator cuff repair    . Carpal tunnel release    . Stomach polyps    . Breast surgery    . Dilation and curettage of uterus    . Breast lumpectomy w/ needle localization Left 06/04/2009    FAMILY HISTORY: Family History  Problem Relation Age of Onset  . Diabetes Father   . Heart disease Father   . Heart attack Father   . Diabetes Sister   . Diabetes Brother     SOCIAL HISTORY: History  Substance Use Topics  . Smoking status: Never Smoker   . Smokeless tobacco: Never Used  . Alcohol Use: No    ALLERGIES: Allergies  Allergen Reactions  . Sulfa Antibiotics Hives, Itching and Rash  . Doryx [Doxycycline] Swelling  . Exemestane Itching  . Femara [Letrozole] Other (See Comments)    Aching joints   . Penicillins Itching  . Suprax [Cefixime] Itching     MEDICATIONS:  Current Outpatient Prescriptions  Medication Sig Dispense Refill  . anastrozole (ARIMIDEX) 1 MG tablet Take 1 tablet (1 mg total) by mouth daily.  90 tablet  3  . Ascorbic Acid (VITAMIN C) 1000 MG tablet  Take 1,000 mg by mouth daily.        . Azelaic Acid (FINACEA EX) Apply topically.      . B Complex-C (SUPER B COMPLEX/VITAMIN C PO) Take 1 tablet by mouth daily.       . Calcium Carbonate-Vit D-Min (CALCIUM 1200 PO) Take 1 tablet by mouth daily.      . cetirizine (ZYRTEC) 10 MG tablet Take 10 mg by mouth daily as needed.       . Cholecalciferol (VITAMIN D) 2000 UNITS tablet Take 2,000 Units by mouth daily.      Marland Kitchen levothyroxine (SYNTHROID, LEVOTHROID) 100 MCG tablet Take 100 mcg by mouth daily.        Marland Kitchen losartan-hydrochlorothiazide (HYZAAR) 100-12.5 MG per tablet Take 1 tablet by mouth  daily.       . Magnesium 250 MG TABS Take by mouth daily.      . Multiple Vitamins-Minerals (ICAPS MV PO) Take by mouth 2 (two) times daily.      . naproxen (NAPROSYN) 500 MG tablet Take 500 mg by mouth as needed.       . Omega-3 Krill Oil 500 MG CAPS Take 1 capsule by mouth daily.      Marland Kitchen omeprazole (PRILOSEC) 20 MG capsule Take 20 mg by mouth 2 (two) times daily.       . VOLTAREN 1 % GEL Apply 2 g topically 4 (four) times daily.        No current facility-administered medications for this visit.      REVIEW OF SYSTEMS: A 10 point review of systems was completed and is negative except as noted above.    PHYSICAL EXAMINATION: BP 126/76  Pulse 85  Temp(Src) 98.7 F (37.1 C) (Oral)  Resp 18  Ht 5\' 2"  (1.575 m)  Wt 192 lb 14.4 oz (87.499 kg)  BMI 35.27 kg/m2   General appearance: Alert, cooperative, well nourished, no apparent distress Head: Normocephalic, without obvious abnormality, atraumatic Eyes: Arcus senilis, PERRLA, EOMI, right eye weeping Nose: Nares, septum and mucosa are normal, no drainage or sinus tenderness Neck: No adenopathy, supple, symmetrical, trachea midline, goiter, no tenderness Resp: Clear to auscultation bilaterally Cardio: Regular rate and rhythm, S1, S2 normal, no murmur, click, rub or gallop Breasts: left breast architectural and radiation changes noted, breasts breast has well-healed surgical scars, bilateral for medial mammary ridge, left axillary fullness, no nipple inversion GI: Soft, distended, non-tender, hypoactive bowel sounds, no organomegaly Extremities: Extremities normal, atraumatic, no cyanosis or edema Lymph nodes: Cervical, supraclavicular, and axillary nodes normal Neurologic: Grossly normal    ECOG FS:  Grade 1 - Symptomatic but completely ambulatory          LAB RESULTS: Lab Results  Component Value Date   WBC 6.8 08/03/2013   NEUTROABS 4.5 08/03/2013   HGB 12.4 08/03/2013   HCT 38.6 08/03/2013   MCV 84.1 08/03/2013   PLT 160  08/03/2013      Chemistry      Component Value Date/Time   NA 139 08/03/2013 0811   NA 135 02/04/2012 1108   K 3.9 08/03/2013 0811   K 3.9 02/04/2012 1108   CL 101 01/27/2013 1057   CL 99 02/04/2012 1108   CO2 27 08/03/2013 0811   CO2 28 02/04/2012 1108   BUN 16.7 08/03/2013 0811   BUN 14 02/04/2012 1108   CREATININE 0.8 08/03/2013 0811   CREATININE 0.65 02/04/2012 1108      Component Value Date/Time   CALCIUM 9.9 08/03/2013 0811  CALCIUM 9.3 02/04/2012 1108   ALKPHOS 90 08/03/2013 0811   ALKPHOS 88 02/04/2012 1108   AST 25 08/03/2013 0811   AST 39* 02/04/2012 1108   ALT 22 08/03/2013 0811   ALT 30 02/04/2012 1108   BILITOT 0.96 08/03/2013 0811   BILITOT 0.9 02/04/2012 1108       Lab Results  Component Value Date   LABCA2 22 07/29/2012     RADIOGRAPHIC STUDIES: No results found.  ASSESSMENT: 78 y.o.[place], [place]: 1.  Status post left breast needle core biopsy at the 6 o'clock position on 05/10/2009 which showed invasive mammary carcinoma, ER 100%, PR 13%, Ki-67 14%, HER2/neu negative with a ratio of 1.38.  2.  Status post left breast needle/wire localized lumpectomy with sentinel node biopsy on 06/04/2009 for a stage I, pT1c pN0, 1.5 cm invasive ductal carcinoma and DCIS, grade 2, ER 100%, PR 13%, Ki-6714%, HER2/neu negative with a ratio of 1.38, 0/2 positive lymph nodes.  3.  Status post radiation therapy from 07/22/2009 through 08/20/2009.  4.  The patient started antiestrogen therapy with Arimidex in 11/2009.  Arimidex was discontinued due to patient  intolerance.  The patient was then started on antiestrogen therapy Femara.  Femara caused her to have aching joints and was discontinued.  The patient then tried antiestrogen therapy with Aromasin which caused her severe pruritus and was discontinued.  The patient then went back to antiestrogen therapy with Arimidex.  5.  Osteopenia   6.  Hot flashes/night sweats  PLAN: 1. continue Arimidex 1 mg daily.  Total of 5 years of therapy will be planned  #2 she'll be seen back in 6 months time for followup  All questions were answered.  The patient was encouraged to contact us in the interim with any problems, questions or concerns.   Drue Second, MD Medical/Oncology [facility] [phone] (beeper) [phone] (Office)   08/03/2013, 9:10 AM

## 2013-08-03 NOTE — Telephone Encounter (Signed)
appts made and printed...td 

## 2014-02-05 ENCOUNTER — Telehealth: Payer: Self-pay | Admitting: Oncology

## 2014-02-05 NOTE — Telephone Encounter (Signed)
, °

## 2014-02-15 ENCOUNTER — Ambulatory Visit: Payer: Medicare Other | Admitting: Oncology

## 2014-02-15 ENCOUNTER — Other Ambulatory Visit: Payer: Medicare Other

## 2014-02-20 ENCOUNTER — Other Ambulatory Visit (HOSPITAL_BASED_OUTPATIENT_CLINIC_OR_DEPARTMENT_OTHER): Payer: Medicare Other

## 2014-02-20 ENCOUNTER — Ambulatory Visit (HOSPITAL_BASED_OUTPATIENT_CLINIC_OR_DEPARTMENT_OTHER): Payer: Medicare Other | Admitting: Oncology

## 2014-02-20 ENCOUNTER — Encounter: Payer: Self-pay | Admitting: Oncology

## 2014-02-20 ENCOUNTER — Telehealth: Payer: Self-pay | Admitting: Oncology

## 2014-02-20 VITALS — BP 132/84 | HR 98 | Temp 98.4°F | Resp 18 | Ht 62.0 in | Wt 189.0 lb

## 2014-02-20 DIAGNOSIS — M899 Disorder of bone, unspecified: Secondary | ICD-10-CM

## 2014-02-20 DIAGNOSIS — C50919 Malignant neoplasm of unspecified site of unspecified female breast: Secondary | ICD-10-CM

## 2014-02-20 DIAGNOSIS — N951 Menopausal and female climacteric states: Secondary | ICD-10-CM

## 2014-02-20 DIAGNOSIS — Z853 Personal history of malignant neoplasm of breast: Secondary | ICD-10-CM

## 2014-02-20 DIAGNOSIS — M949 Disorder of cartilage, unspecified: Secondary | ICD-10-CM

## 2014-02-20 DIAGNOSIS — C50912 Malignant neoplasm of unspecified site of left female breast: Secondary | ICD-10-CM

## 2014-02-20 DIAGNOSIS — M8580 Other specified disorders of bone density and structure, unspecified site: Secondary | ICD-10-CM | POA: Insufficient documentation

## 2014-02-20 DIAGNOSIS — Z170 Estrogen receptor positive status [ER+]: Secondary | ICD-10-CM

## 2014-02-20 LAB — COMPREHENSIVE METABOLIC PANEL (CC13)
ALT: 18 U/L (ref 0–55)
ANION GAP: 16 meq/L — AB (ref 3–11)
AST: 24 U/L (ref 5–34)
Albumin: 3.9 g/dL (ref 3.5–5.0)
Alkaline Phosphatase: 85 U/L (ref 40–150)
BUN: 17.5 mg/dL (ref 7.0–26.0)
CO2: 24 meq/L (ref 22–29)
Calcium: 9.7 mg/dL (ref 8.4–10.4)
Chloride: 100 mEq/L (ref 98–109)
Creatinine: 0.9 mg/dL (ref 0.6–1.1)
GLUCOSE: 121 mg/dL (ref 70–140)
POTASSIUM: 4.3 meq/L (ref 3.5–5.1)
Sodium: 140 mEq/L (ref 136–145)
Total Bilirubin: 0.76 mg/dL (ref 0.20–1.20)
Total Protein: 7.9 g/dL (ref 6.4–8.3)

## 2014-02-20 LAB — CBC WITH DIFFERENTIAL/PLATELET
BASO%: 1 % (ref 0.0–2.0)
Basophils Absolute: 0.1 10*3/uL (ref 0.0–0.1)
EOS ABS: 0.1 10*3/uL (ref 0.0–0.5)
EOS%: 2.5 % (ref 0.0–7.0)
HCT: 39.7 % (ref 34.8–46.6)
HEMOGLOBIN: 12.8 g/dL (ref 11.6–15.9)
LYMPH%: 25.9 % (ref 14.0–49.7)
MCH: 27.2 pg (ref 25.1–34.0)
MCHC: 32.2 g/dL (ref 31.5–36.0)
MCV: 84.6 fL (ref 79.5–101.0)
MONO#: 0.4 10*3/uL (ref 0.1–0.9)
MONO%: 7.9 % (ref 0.0–14.0)
NEUT%: 62.7 % (ref 38.4–76.8)
NEUTROS ABS: 3.4 10*3/uL (ref 1.5–6.5)
Platelets: 166 10*3/uL (ref 145–400)
RBC: 4.7 10*6/uL (ref 3.70–5.45)
RDW: 15 % — AB (ref 11.2–14.5)
WBC: 5.4 10*3/uL (ref 3.9–10.3)
lymph#: 1.4 10*3/uL (ref 0.9–3.3)

## 2014-02-20 MED ORDER — ANASTROZOLE 1 MG PO TABS: 1.0000 mg | ORAL_TABLET | Freq: Every day | ORAL | Status: DC

## 2014-02-20 NOTE — Telephone Encounter (Signed)
per orders to sch mamma & BD-adv pt no pof for appt/will call pt with appt once recvd-pt understood-gave pt copy of sch for BD & mamma

## 2014-02-20 NOTE — Progress Notes (Signed)
[place]  Telephone:(336) [phone] Fax:(336) [phone]  OFFICE PROGRESS NOTE  PATIENT: [name]   DOB: [birth year]  MR#: [id number]  SVX#:[id number]   QZ:[id number],AUQJFHL ALAN, MD Alphonsa Overall, M.D. Kyung Rudd, M.D.  DIAGNOSIS:  A 79 year old [place], [place] woman with a history of invasive ductal carcinoma and DCIS of the left breast diagnosed in 04/2009.   PRIOR THERAPY: 1.  Status post left breast needle core biopsy at the 6 o'clock position on 05/10/2009 which showed invasive mammary carcinoma, ER 100%, PR 13%, Ki-67 14%, HER2/neu negative with a ratio of 1.38.  2.  Status post left breast needle/wire localized lumpectomy with sentinel node biopsy on 06/04/2009 for a stage I, pT1c pN0, 1.5 cm invasive ductal carcinoma and DCIS, grade 2, ER 100%, PR 13%, Ki-6714%, HER2/neu negative with a ratio of 1.38, 0/2 positive lymph nodes.  3.  Status post radiation therapy from 07/22/2009 through 08/20/2009.  4.  The patient started antiestrogen therapy with Arimidex in 11/2009.  Arimidex was discontinued due to patient  intolerance.  The patient was then started on antiestrogen therapy Femara.  Femara caused her to have aching joints and was discontinued.  The patient then tried antiestrogen therapy with Aromasin which caused her severe pruritus and was discontinued.  The patient then went back to antiestrogen therapy with Arimidex.  CURRENT THERAPY:  Arimidex 1 mg PO daily.   INTERVAL HISTORY:  [name] is seen today for follow up of left breast invasive ductal carcinoma.   Since her last office visit the patient reports that she has ongoing bilateral hand pain, left arm and left breast pain, hot flashes and night sweats.  The patient denies any other symptomatology. Remainder of the tampon review of systems is negative  PAST MEDICAL HISTORY: Past Medical History  Diagnosis Date  . Arthritis   . Cough   . Nasal congestion   . Arthritis pain   . Cancer  04/2009    Left breast cancer  . Hypertension   . Hyperlipidemia   . GERD (gastroesophageal reflux disease)   . Sinusitis   . Thyroid disease   . Sleep apnea   . Cryptosporidial gastroenteritis     PAST SURGICAL HISTORY: Past Surgical History  Procedure Laterality Date  . Tonsillectomy    . Cholecystectomy    . Rotator cuff repair    . Carpal tunnel release    . Stomach polyps    . Breast surgery    . Dilation and curettage of uterus    . Breast lumpectomy w/ needle localization Left 06/04/2009    FAMILY HISTORY: Family History  Problem Relation Age of Onset  . Diabetes Father   . Heart disease Father   . Heart attack Father   . Diabetes Sister   . Diabetes Brother     SOCIAL HISTORY: History  Substance Use Topics  . Smoking status: Never Smoker   . Smokeless tobacco: Never Used  . Alcohol Use: No    ALLERGIES: Allergies  Allergen Reactions  . Sulfa Antibiotics Hives, Itching and Rash  . Doryx [Doxycycline] Swelling  . Exemestane Itching  . Femara [Letrozole] Other (See Comments)    Aching joints   . Penicillins Itching  . Suprax [Cefixime] Itching     MEDICATIONS:  Current Outpatient Prescriptions  Medication Sig Dispense Refill  . anastrozole (ARIMIDEX) 1 MG tablet Take 1 tablet (1 mg total) by mouth daily.  90 tablet  3  . Ascorbic Acid (VITAMIN C) 1000 MG tablet  Take 1,000 mg by mouth daily.        . Azelaic Acid (FINACEA EX) Apply topically.      . B Complex-C (SUPER B COMPLEX/VITAMIN C PO) Take 1 tablet by mouth daily.       . Calcium Carbonate-Vit D-Min (CALCIUM 1200 PO) Take 1 tablet by mouth daily.      . cetirizine (ZYRTEC) 10 MG tablet Take 10 mg by mouth daily as needed.       . Cholecalciferol (VITAMIN D) 2000 UNITS tablet Take 2,000 Units by mouth daily.      Marland Kitchen levothyroxine (SYNTHROID, LEVOTHROID) 100 MCG tablet Take 100 mcg by mouth daily.        Marland Kitchen losartan-hydrochlorothiazide (HYZAAR) 100-12.5 MG per tablet Take 1 tablet by mouth  daily.       . Magnesium 250 MG TABS Take by mouth daily.      . Multiple Vitamins-Minerals (ICAPS MV PO) Take by mouth 2 (two) times daily.      . naproxen (NAPROSYN) 500 MG tablet Take 500 mg by mouth as needed.       . Omega-3 Krill Oil 500 MG CAPS Take 1 capsule by mouth daily.      Marland Kitchen omeprazole (PRILOSEC) 20 MG capsule Take 20 mg by mouth 2 (two) times daily.       . VOLTAREN 1 % GEL Apply 2 g topically 4 (four) times daily.        No current facility-administered medications for this visit.      REVIEW OF SYSTEMS: A 10 point review of systems was completed and is negative except as noted above.    PHYSICAL EXAMINATION: There were no vitals taken for this visit.   General appearance: Alert, cooperative, well nourished, no apparent distress Head: Normocephalic, without obvious abnormality, atraumatic Eyes: Arcus senilis, PERRLA, EOMI, right eye weeping Nose: Nares, septum and mucosa are normal, no drainage or sinus tenderness Neck: No adenopathy, supple, symmetrical, trachea midline, goiter, no tenderness Resp: Clear to auscultation bilaterally Cardio: Regular rate and rhythm, S1, S2 normal, no murmur, click, rub or gallop Breasts: left breast architectural and radiation changes noted, breasts breast has well-healed surgical scars, bilateral for medial mammary ridge, left axillary fullness, no nipple inversion GI: Soft, distended, non-tender, hypoactive bowel sounds, no organomegaly Extremities: Extremities normal, atraumatic, no cyanosis or edema Lymph nodes: Cervical, supraclavicular, and axillary nodes normal Neurologic: Grossly normal    ECOG FS:  Grade 1 - Symptomatic but completely ambulatory          LAB RESULTS: Lab Results  Component Value Date   WBC 6.8 08/03/2013   NEUTROABS 4.5 08/03/2013   HGB 12.4 08/03/2013   HCT 38.6 08/03/2013   MCV 84.1 08/03/2013   PLT 160 08/03/2013      Chemistry      Component Value Date/Time   NA 139 08/03/2013 0811   NA  135 02/04/2012 1108   K 3.9 08/03/2013 0811   K 3.9 02/04/2012 1108   CL 101 01/27/2013 1057   CL 99 02/04/2012 1108   CO2 27 08/03/2013 0811   CO2 28 02/04/2012 1108   BUN 16.7 08/03/2013 0811   BUN 14 02/04/2012 1108   CREATININE 0.8 08/03/2013 0811   CREATININE 0.65 02/04/2012 1108      Component Value Date/Time   CALCIUM 9.9 08/03/2013 0811   CALCIUM 9.3 02/04/2012 1108   ALKPHOS 90 08/03/2013 0811   ALKPHOS 88 02/04/2012 1108   AST 25 08/03/2013 1062  AST 39* 02/04/2012 1108   ALT 22 08/03/2013 0811   ALT 30 02/04/2012 1108   BILITOT 0.96 08/03/2013 0811   BILITOT 0.9 02/04/2012 1108       Lab Results  Component Value Date   LABCA2 22 07/29/2012     RADIOGRAPHIC STUDIES: No results found.  ASSESSMENT: 79 y.o.[place], [place] woman: 1.  Status post left breast needle core biopsy at the 6 o'clock position on 05/10/2009 which showed invasive mammary carcinoma, ER 100%, PR 13%, Ki-67 14%, HER2/neu negative with a ratio of 1.38.  2.  Status post left breast needle/wire localized lumpectomy with sentinel node biopsy on 06/04/2009 for a stage I, pT1c pN0, 1.5 cm invasive ductal carcinoma and DCIS, grade 2, ER 100%, PR 13%, Ki-6714%, HER2/neu negative with a ratio of 1.38, 0/2 positive lymph nodes.  3.  Status post radiation therapy from 07/22/2009 through 08/20/2009.  4.  The patient started antiestrogen therapy with Arimidex in 11/2009.  Arimidex was discontinued due to patient  intolerance.  The patient was then started on antiestrogen therapy Femara.  Femara caused her to have aching joints and was discontinued.  The patient then tried antiestrogen therapy with Aromasin which caused her severe pruritus and was discontinued.  The patient then went back to antiestrogen therapy with Arimidex.  5.  Osteopenia   6.  Hot flashes/night sweats  PLAN: 1. continue Arimidex 1 mg daily. Total of 5 years of therapy will be planned. Mammogram has been requested for October  2015.  2. Bone density scan is due and I have requested that this be scheduled in the next few weeks.  3.  she'll be seen back in 6 months time for followup  All questions were answered.  The patient was encouraged to contact us in the interim with any problems, questions or concerns.    Mikey Bussing, DNP, AGPCNP-BC 02/20/2014, 9:19 AM

## 2014-07-13 ENCOUNTER — Ambulatory Visit
Admission: RE | Admit: 2014-07-13 | Discharge: 2014-07-13 | Disposition: A | Discharge status: Home / Self Care | Payer: Medicare Other | Source: Ambulatory Visit | Attending: Oncology | Admitting: Oncology

## 2014-07-13 DIAGNOSIS — M8580 Other specified disorders of bone density and structure, unspecified site: Secondary | ICD-10-CM

## 2014-07-13 DIAGNOSIS — Z853 Personal history of malignant neoplasm of breast: Secondary | ICD-10-CM

## 2014-07-26 ENCOUNTER — Telehealth: Payer: Self-pay

## 2014-07-26 NOTE — Telephone Encounter (Signed)
Bone density results rcvd from the breast center dtd 07/13/14.  Copy to Dr Lindi Adie.  Original to scan.

## 2014-08-02 ENCOUNTER — Other Ambulatory Visit: Payer: Self-pay | Admitting: Oncology

## 2014-08-02 DIAGNOSIS — C50912 Malignant neoplasm of unspecified site of left female breast: Secondary | ICD-10-CM

## 2014-08-22 ENCOUNTER — Telehealth: Payer: Self-pay | Admitting: Hematology

## 2014-08-22 NOTE — Telephone Encounter (Signed)
Confirm appt for Feb Mailed cal.

## 2014-10-25 ENCOUNTER — Encounter: Payer: Self-pay | Admitting: Adult Health

## 2014-10-25 ENCOUNTER — Encounter: Payer: Self-pay | Admitting: Hematology

## 2014-10-25 ENCOUNTER — Telehealth: Payer: Self-pay | Admitting: Hematology

## 2014-10-25 ENCOUNTER — Other Ambulatory Visit (HOSPITAL_BASED_OUTPATIENT_CLINIC_OR_DEPARTMENT_OTHER): Payer: Medicare Other

## 2014-10-25 ENCOUNTER — Ambulatory Visit (HOSPITAL_BASED_OUTPATIENT_CLINIC_OR_DEPARTMENT_OTHER): Payer: Medicare Other | Admitting: Hematology

## 2014-10-25 VITALS — BP 106/84 | HR 87 | Temp 98.0°F | Resp 18 | Wt 188.2 lb

## 2014-10-25 DIAGNOSIS — R61 Generalized hyperhidrosis: Secondary | ICD-10-CM

## 2014-10-25 DIAGNOSIS — N951 Menopausal and female climacteric states: Secondary | ICD-10-CM

## 2014-10-25 DIAGNOSIS — C50812 Malignant neoplasm of overlapping sites of left female breast: Secondary | ICD-10-CM

## 2014-10-25 DIAGNOSIS — Z853 Personal history of malignant neoplasm of breast: Secondary | ICD-10-CM

## 2014-10-25 DIAGNOSIS — M8580 Other specified disorders of bone density and structure, unspecified site: Secondary | ICD-10-CM

## 2014-10-25 LAB — CBC WITH DIFFERENTIAL/PLATELET
BASO%: 0.8 % (ref 0.0–2.0)
Basophils Absolute: 0.1 10*3/uL (ref 0.0–0.1)
EOS%: 1.9 % (ref 0.0–7.0)
Eosinophils Absolute: 0.1 10*3/uL (ref 0.0–0.5)
HCT: 39.5 % (ref 34.8–46.6)
HEMOGLOBIN: 12.6 g/dL (ref 11.6–15.9)
LYMPH%: 26.3 % (ref 14.0–49.7)
MCH: 26.6 pg (ref 25.1–34.0)
MCHC: 31.7 g/dL (ref 31.5–36.0)
MCV: 83.6 fL (ref 79.5–101.0)
MONO#: 0.5 10*3/uL (ref 0.1–0.9)
MONO%: 8.1 % (ref 0.0–14.0)
NEUT#: 4.2 10*3/uL (ref 1.5–6.5)
NEUT%: 62.9 % (ref 38.4–76.8)
Platelets: 164 10*3/uL (ref 145–400)
RBC: 4.73 10*6/uL (ref 3.70–5.45)
RDW: 15.1 % — ABNORMAL HIGH (ref 11.2–14.5)
WBC: 6.6 10*3/uL (ref 3.9–10.3)
lymph#: 1.7 10*3/uL (ref 0.9–3.3)

## 2014-10-25 LAB — COMPREHENSIVE METABOLIC PANEL (CC13)
ALK PHOS: 89 U/L (ref 40–150)
ALT: 18 U/L (ref 0–55)
ANION GAP: 11 meq/L (ref 3–11)
AST: 21 U/L (ref 5–34)
Albumin: 3.9 g/dL (ref 3.5–5.0)
BUN: 17.8 mg/dL (ref 7.0–26.0)
CO2: 28 mEq/L (ref 22–29)
CREATININE: 0.8 mg/dL (ref 0.6–1.1)
Calcium: 9.8 mg/dL (ref 8.4–10.4)
Chloride: 100 mEq/L (ref 98–109)
EGFR: 70 mL/min/{1.73_m2} — AB (ref >90)
GLUCOSE: 117 mg/dL (ref 70–140)
POTASSIUM: 4.2 meq/L (ref 3.5–5.1)
Sodium: 140 mEq/L (ref 136–145)
TOTAL PROTEIN: 7.8 g/dL (ref 6.4–8.3)
Total Bilirubin: 0.91 mg/dL (ref 0.20–1.20)

## 2014-10-25 NOTE — Progress Notes (Signed)
[place]  [phone] [phone]  OFFICE PROGRESS NOTE  PATIENT: [name]   DOB: June 29, 1935  MR#: [id number]  JSH#:[id number]   IF:OYDXA,JOINOMV Antony Haste, MD Alphonsa Overall, M.D. Kyung Rudd, M.D.  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   PRIOR THERAPY: 1.  Status post left breast needle core biopsy at the 6 o'clock position on 05/10/2009 which showed invasive mammary carcinoma, ER 100%, PR 13%, Ki-67 14%, HER2/neu negative with a ratio of 1.38.  2.  Status post left breast needle/wire localized lumpectomy with sentinel node biopsy on 06/04/2009 for a stage I, pT1c pN0, 1.5 cm invasive ductal carcinoma and DCIS, grade 2, ER 100%, PR 13%, Ki-6714%, HER2/neu negative with a ratio of 1.38, 0/2 positive lymph nodes.  3.  Status post radiation therapy from 07/22/2009 through 08/20/2009.  4.  The patient started antiestrogen therapy with Arimidex in 11/2009.  Arimidex was discontinued due to patient  intolerance.  The patient was then started on antiestrogen therapy Femara.  Femara caused her to have aching joints and was discontinued.  The patient then tried antiestrogen therapy with Aromasin which caused her severe pruritus and was discontinued.  The patient then went back to antiestrogen therapy with Arimidex.  CURRENT THERAPY:  Arimidex 1 mg PO daily.   INTERVAL HISTORY:  [name] is seen today for follow up of left breast invasive ductal carcinoma.  She has ben tolerating Arimidex well, no significant join pain. She has hot flush at night, but tolerable. No other complains.     PAST MEDICAL HISTORY: Past Medical History  Diagnosis Date  . Arthritis   . Cough   . Nasal congestion   . Arthritis pain   . Cancer 04/2009    Left breast cancer  . Hypertension   . Hyperlipidemia   . GERD (gastroesophageal reflux disease)   .  Sinusitis   . Thyroid disease   . Sleep apnea   . Cryptosporidial gastroenteritis     PAST SURGICAL HISTORY: Past Surgical History  Procedure Laterality Date  . Tonsillectomy    . Cholecystectomy    . Rotator cuff repair    . Carpal tunnel release    . Stomach polyps    . Breast surgery    . Dilation and curettage of uterus    . Breast lumpectomy w/ needle localization Left 06/04/2009    FAMILY HISTORY: Family History  Problem Relation Age of Onset  . Diabetes Father   . Heart disease Father   . Heart attack Father   . Diabetes Sister   . Diabetes Brother     SOCIAL HISTORY: History  Substance Use Topics  . Smoking status: Never Smoker   . Smokeless tobacco: Never Used  . Alcohol Use: No    ALLERGIES: Allergies  Allergen Reactions  . Sulfa Antibiotics Hives, Itching and Rash  . Doryx [Doxycycline] Swelling  . Exemestane Itching  . Femara [Letrozole] Other (See Comments)    Aching joints   . Penicillins Itching  . Suprax [Cefixime] Itching     MEDICATIONS:  Current Outpatient Prescriptions  Medication Sig Dispense Refill  . anastrozole (ARIMIDEX) 1 MG tablet TAKE 1 TABLET (1 MG TOTAL) BY MOUTH DAILY. 90 tablet 0  . Ascorbic Acid (VITAMIN C) 1000 MG tablet Take 1,000 mg by mouth daily.      . Azelaic Acid (FINACEA EX) Apply topically.    Marland Kitchen  B Complex-C (SUPER B COMPLEX/VITAMIN C PO) Take 1 tablet by mouth daily.     . Calcium Carbonate-Vit D-Min (CALCIUM 1200 PO) Take 1 tablet by mouth daily.    . cetirizine (ZYRTEC) 10 MG tablet Take 10 mg by mouth daily as needed.     Marland Kitchen FINACEA 15 % cream For  Rosacea on face.  2  . levothyroxine (SYNTHROID, LEVOTHROID) 100 MCG tablet Take 100 mcg by mouth daily.      Marland Kitchen losartan-hydrochlorothiazide (HYZAAR) 100-12.5 MG per tablet Take 1 tablet by mouth daily.     . Magnesium 250 MG TABS Take by mouth daily.    . Multiple Vitamins-Minerals (ICAPS MV PO) Take by mouth 2 (two) times daily.    . naproxen (NAPROSYN) 500 MG  tablet Take 500 mg by mouth as needed.     . Omega-3 Krill Oil 500 MG CAPS Take 1 capsule by mouth daily.    Marland Kitchen omeprazole (PRILOSEC) 20 MG capsule Take 20 mg by mouth daily.     . VOLTAREN 1 % GEL Apply 2 g topically 4 (four) times daily.      No current facility-administered medications for this visit.      REVIEW OF SYSTEMS: A 10 point review of systems was completed and is negative except as noted above.    PHYSICAL EXAMINATION: BP 106/84 mmHg  Pulse 87  Temp(Src) 98 F (36.7 C) (Oral)  Resp 18  Wt 188 lb 3.2 oz (85.367 kg)  SpO2 98%   General appearance: Alert, cooperative, well nourished, no apparent distress Head: Normocephalic, without obvious abnormality, atraumatic Eyes: Arcus senilis, PERRLA, EOMI, right eye weeping Nose: Nares, septum and mucosa are normal, no drainage or sinus tenderness Neck: No adenopathy, supple, symmetrical, trachea midline, goiter, no tenderness Resp: Clear to auscultation bilaterally Cardio: Regular rate and rhythm, S1, S2 normal, no murmur, click, rub or gallop Breasts: left breast architectural and radiation changes noted, breasts breast has well-healed surgical scars, bilateral for medial mammary ridge, left axillary fullness, no nipple inversion GI: Soft, distended, non-tender, hypoactive bowel sounds, no organomegaly Extremities: Extremities normal, atraumatic, no cyanosis or edema Lymph nodes: Cervical, supraclavicular, and axillary nodes normal Neurologic: Grossly normal    ECOG FS:  Grade 1 - Symptomatic but completely ambulatory          LAB RESULTS: Lab Results  Component Value Date   WBC 6.6 10/25/2014   NEUTROABS 4.2 10/25/2014   HGB 12.6 10/25/2014   HCT 39.5 10/25/2014   MCV 83.6 10/25/2014   PLT 164 10/25/2014      Chemistry      Component Value Date/Time   NA 140 10/25/2014 0929   NA 135 02/04/2012 1108   K 4.2 10/25/2014 0929   K 3.9 02/04/2012 1108   CL 101 01/27/2013 1057   CL 99 02/04/2012 1108   CO2 28  10/25/2014 0929   CO2 28 02/04/2012 1108   BUN 17.8 10/25/2014 0929   BUN 14 02/04/2012 1108   CREATININE 0.8 10/25/2014 0929   CREATININE 0.65 02/04/2012 1108      Component Value Date/Time   CALCIUM 9.8 10/25/2014 0929   CALCIUM 9.3 02/04/2012 1108   ALKPHOS 89 10/25/2014 0929   ALKPHOS 88 02/04/2012 1108   AST 21 10/25/2014 0929   AST 39* 02/04/2012 1108   ALT 18 10/25/2014 0929   ALT 30 02/04/2012 1108   BILITOT 0.91 10/25/2014 0929   BILITOT 0.9 02/04/2012 1108       Lab Results  Component Value Date   LABCA2 22 07/29/2012     RADIOGRAPHIC STUDIES: No results found.  ASSESSMENT: 80 y.o.[place], [place] woman: 1.  Status post left breast needle core biopsy at the 6 o'clock position on 05/10/2009 which showed invasive mammary carcinoma, ER 100%, PR 13%, Ki-67 14%, HER2/neu negative with a ratio of 1.38.  2.  Status post left breast needle/wire localized lumpectomy with sentinel node biopsy on 06/04/2009 for a stage I, pT1c pN0, 1.5 cm invasive ductal carcinoma and DCIS, grade 2, ER 100%, PR 13%, Ki-6714%, HER2/neu negative with a ratio of 1.38, 0/2 positive lymph nodes.  3.  Status post radiation therapy from 07/22/2009 through 08/20/2009.  4.  The patient started antiestrogen therapy with Arimidex in 11/2009.  Arimidex was discontinued due to patient  intolerance.  The patient was then started on antiestrogen therapy Femara.  Femara caused her to have aching joints and was discontinued.  The patient then tried antiestrogen therapy with Aromasin which caused her severe pruritus and was discontinued.  The patient then went back to antiestrogen therapy with Arimidex and tolerating it well.   5.  Osteopenia, last bone scan in 06/2014  6.  Hot flashes/night sweats, tolerable   PLAN: 1. continue Arimidex 1 mg daily until Mach 2016.  2. See Survivor ship in the fall of 2016, and then follow up there or with me yearly.  3. Mammogram yearly, next due in Oct 2016     All questions were answered.  The patient was encouraged to contact us in the interim with any problems, questions or concerns.    Truitt Merle   10/25/2014, 10:35 AM

## 2014-10-25 NOTE — Telephone Encounter (Signed)
Gave pt AVS, sent msg to Mike Craze to call pt concerning Survivorship and that labs needed to be added before visit per 02/04 POF... KJ

## 2014-10-25 NOTE — Progress Notes (Signed)
This encounter was created in error - please disregard.

## 2014-10-29 ENCOUNTER — Telehealth: Payer: Self-pay | Admitting: Adult Health

## 2014-10-29 NOTE — Telephone Encounter (Signed)
I spoke with [name] and explained the purpose of the Survivorship Clinic, as well as Dr. Ernestina Penna wishes for her to have [name] see me for her continued oncology follow-up.  She is scheduled to have her annual mammogram in October 2016 and will see me in November 2016.  We scheduled [name] Clinic appointment for 07/29/15 at 10am, with labs preceding the appt at 9:30am.  [name] with verbal understanding of her scheduled appointments.  She was encouraged to call with any questions or concerns before her scheduled appointment.   Mike Craze, NP [place] [phone]

## 2014-10-30 ENCOUNTER — Telehealth: Payer: Self-pay | Admitting: Hematology

## 2014-10-30 NOTE — Telephone Encounter (Signed)
Pt is scheduled for labs in Nov per 02/04 POF, pt is aware per Mike Craze.... KJ

## 2014-10-31 ENCOUNTER — Other Ambulatory Visit: Payer: Self-pay | Admitting: *Deleted

## 2014-10-31 ENCOUNTER — Other Ambulatory Visit: Payer: Self-pay | Admitting: Oncology

## 2014-10-31 ENCOUNTER — Telehealth: Payer: Self-pay | Admitting: Hematology

## 2014-10-31 DIAGNOSIS — Z853 Personal history of malignant neoplasm of breast: Secondary | ICD-10-CM

## 2014-10-31 NOTE — Telephone Encounter (Signed)
pof sent 2/10 removed from scheduling inbox. next annual appt can be scheduled after pt sees [place] in November.

## 2015-02-27 ENCOUNTER — Telehealth: Payer: Self-pay | Admitting: *Deleted

## 2015-02-27 NOTE — Telephone Encounter (Signed)
Called pt back & suggested she try some hydrocortisone cream per Dr Burr Medico & wear lt cotton clothing & if not better, she should see her PCP or Dr Burr Medico.  Pt should call for appt if not better.  Pt expressed understanding.

## 2015-02-27 NOTE — Telephone Encounter (Signed)
Received vm call from pt stating that she had been released by Dr Burr Medico but she is having a problem with her left breast feeling tender.  She states that it kept her up last hs with itching & soreness.  She reports that this has happened before & she used benadryl cream which helped but has not helped this time. Called pt & she reports that breast feels "touchy"-uncomfortable for clothes to rub against breast.  She reports no redness or heat or drainage. She wants to know if there is another cream that might help.  Note to Dr Burr Medico.

## 2015-04-26 ENCOUNTER — Other Ambulatory Visit: Payer: Self-pay

## 2015-04-26 ENCOUNTER — Other Ambulatory Visit: Payer: Self-pay | Admitting: Obstetrics and Gynecology

## 2015-04-26 DIAGNOSIS — Z853 Personal history of malignant neoplasm of breast: Secondary | ICD-10-CM

## 2015-07-17 ENCOUNTER — Ambulatory Visit
Admission: RE | Admit: 2015-07-17 | Discharge: 2015-07-17 | Disposition: A | Discharge status: Home / Self Care | Payer: Medicare Other | Source: Ambulatory Visit | Attending: Obstetrics and Gynecology | Admitting: Obstetrics and Gynecology

## 2015-07-17 DIAGNOSIS — Z853 Personal history of malignant neoplasm of breast: Secondary | ICD-10-CM

## 2015-07-23 ENCOUNTER — Telehealth: Payer: Self-pay | Admitting: Adult Health

## 2015-07-23 NOTE — Telephone Encounter (Signed)
I spoke with Ms. Chakraborty regarding her upcoming survivorship appt, previously scheduled with me for 07/29/15.  I let her know that we now have a breast survivorship NP and I would like to move her appointment for her to see Chestine Spore, NP.  I briefly reviewed for Ms. Lundin the purpose of her appt and the role that survivorship has in her care.    She agreed to have her appt moved to see Heather on 08/07/15.  She does not need labs for this visit and I explained to her that the national guidelines do not recommend routine labs for cancer survivors; that we would only order them if we needed to for additional evaluation of any reported concern the patient may have.  She tells me that overall she is doing well.    She had her mammogram last month at [facility] and is unsure if we received her records for the mammogram.  I let Nira Conn know to inquire about her imaging results prior to the appt.  Per patient, "my mammogram was fine."  She would also like an annual flu vaccine at her visit with [facility].  I've made Heather aware of the patient's request for this as well.    We look forward to participating in her care.   Mike Craze, NP [place] [phone]

## 2015-07-29 ENCOUNTER — Ambulatory Visit: Payer: No Typology Code available for payment source | Admitting: Adult Health

## 2015-07-29 ENCOUNTER — Other Ambulatory Visit: Payer: No Typology Code available for payment source

## 2015-08-07 ENCOUNTER — Telehealth: Payer: Self-pay | Admitting: Nurse Practitioner

## 2015-08-07 ENCOUNTER — Encounter: Payer: Self-pay | Admitting: Nurse Practitioner

## 2015-08-07 ENCOUNTER — Ambulatory Visit (HOSPITAL_BASED_OUTPATIENT_CLINIC_OR_DEPARTMENT_OTHER): Payer: Medicare Other | Admitting: Nurse Practitioner

## 2015-08-07 VITALS — BP 120/55 | HR 97 | Temp 98.0°F | Resp 18 | Ht 62.0 in | Wt 190.0 lb

## 2015-08-07 DIAGNOSIS — Z23 Encounter for immunization: Secondary | ICD-10-CM

## 2015-08-07 DIAGNOSIS — Z780 Asymptomatic menopausal state: Secondary | ICD-10-CM

## 2015-08-07 DIAGNOSIS — Z853 Personal history of malignant neoplasm of breast: Secondary | ICD-10-CM

## 2015-08-07 MED ORDER — INFLUENZA VAC SPLIT QUAD 0.5 ML IM SUSY
0.5000 mL | PREFILLED_SYRINGE | Freq: Once | INTRAMUSCULAR | Status: AC
  Administered 2015-08-07: 0.5 mL via INTRAMUSCULAR
  Filled 2015-08-07: qty 0.5

## 2015-08-07 NOTE — Progress Notes (Signed)
CLINIC:  Cancer Survivorship   REASON FOR VISIT:  Routine follow-up post-treatment for history of breast cancer.   HISTORY OF PRESENT ILLNESS:  Ms. Lippert presents to the Survivorship Clinic today for ongoing follow up regarding her history of breast cancer. In August 2010, she was diagnosed with invasive ductal carcinoma with DCIS of the left breast. This was ER positive at 100%, PR positive at 13%, HER2/neu negative (ratio 1.38) with a Ki-67 of 14%. She elected treatment with lumpectomy with sentinel lymph node biopsy which was performed on 06/03/2009 revealing Stage IA (pT1c pN0) invasive ductal carcinoma that measured 1.5 cm with areas of DCIS. 2 lymph nodes were removed and were negative for malignancy. She underwent adjuvant radiation therapy from 07/22/2009 to 08/20/2009. This was followed by adjuvant endocrine therapy initially with anastrozole in March 2011, but later changed to letrozole due to poor tolerance (arthralgias).  Unfortunately, Ms. Wedeking experienced significant side effects with the letrozole and was changed to exemestane but could not tolerate it due to pruritus.  She was placed back on anastrozole which she continued on through February 2016, at which time she completed 5 years of therapy. She has been followed in a program of surveillance since that time.  Ms. Markert states that overall she is doing fairly well.  She continues with some tenderness / cramping along her left breast which occurs intermittently.  Her most recent mammogram from October 2016 was unrevealing.  She denies any mass or lesion. She has had no headache, cough, or shortness of breath. She does have arthritis pain which is unchanged. Otherwise, she has no complaints of bone pain. She does have a history of rosacea and continues to see her dermatologist regularly. She reports good appetite and denies any weight loss.   REVIEW OF SYSTEMS:  General: Denies fever, chills, unintentional weight loss, or  generalized fatigue.  HEENT: Sinus problems.  Denies visual changes, hearing loss, mouth sores, or difficulty swallowing. Cardiac: Denies palpitations, chest pain, and lower extremity edema.  Respiratory: Denies dyspnea on exertion.  Breast: Cramping / tenderness in left breast following treatment as above. Denies any new nodularity, masses, tenderness, nipple changes, or nipple discharge.  GI: Rare episodes of fecal incontinence x 6 months.  No bleeding. Denies abdominal pain, constipation, diarrhea, nausea, or vomiting.  GU: Intermittent episodes of urinary incontinence with coughing / sneezing. Denies dysuria, hematuria, vaginal bleeding, vaginal discharge, or vaginal dryness.  Musculoskeletal: Arthritis pain as above, otherwise, denies joint or bone pain.  Neuro: Denies recent falls or peripheral neuropathy. Skin: Rosacea as above, otherwise, denies rash, pruritis, or open wounds.  Psych: Denies depression, anxiety, insomnia, or memory loss.   A 14-point review of systems was completed and was negative, except as noted above.   ONCOLOGY TREATMENT TEAM:  1. Surgeon:  Dr. Lucia Gaskins at [facility]  2. Medical Oncologist: Dr. Burr Medico   PAST MEDICAL/SURGICAL HISTORY:  Past Medical History  Diagnosis Date  . Arthritis   . Cough   . Nasal congestion   . Arthritis pain   . Cancer ([place]) 04/2009    Left breast cancer  . Hypertension   . Hyperlipidemia   . GERD (gastroesophageal reflux disease)   . Sinusitis   . Thyroid disease   . Sleep apnea   . 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 Tonsillectomy    . Cholecystectomy    . Rotator cuff repair    . Carpal tunnel release    .  Stomach polyps    . Breast surgery    . Dilation and curettage of uterus    . Breast lumpectomy w/ needle localization Left 06/04/2009     ALLERGIES:  Allergies  Allergen Reactions  . Sulfa Antibiotics Hives, Itching and Rash  . Doryx  [Doxycycline] Swelling  . Exemestane Itching  . Femara [Letrozole] Other (See Comments)    Aching joints   . Penicillins Itching  . Suprax [Cefixime] Itching     CURRENT MEDICATIONS:  Current Outpatient Prescriptions on File Prior to Visit  Medication Sig Dispense Refill  . Ascorbic Acid (VITAMIN C) 1000 MG tablet Take 1,000 mg by mouth daily.      . B Complex-C (SUPER B COMPLEX/VITAMIN C PO) Take 1 tablet by mouth daily.     . Calcium Carbonate-Vit D-Min (CALCIUM 1200 PO) Take 1 tablet by mouth daily.    . cetirizine (ZYRTEC) 10 MG tablet Take 10 mg by mouth daily as needed.     Marland Kitchen FINACEA 15 % cream For  Rosacea on face.  2  . levothyroxine (SYNTHROID, LEVOTHROID) 100 MCG tablet Take 100 mcg by mouth daily.      Marland Kitchen losartan-hydrochlorothiazide (HYZAAR) 100-12.5 MG per tablet Take 1 tablet by mouth daily.     . Magnesium 250 MG TABS Take by mouth daily.    . Multiple Vitamins-Minerals (ICAPS MV PO) Take by mouth 2 (two) times daily.    . naproxen (NAPROSYN) 500 MG tablet Take 500 mg by mouth as needed.     . Omega-3 Krill Oil 500 MG CAPS Take 1 capsule by mouth daily.    Marland Kitchen omeprazole (PRILOSEC) 20 MG capsule Take 20 mg by mouth daily.     . VOLTAREN 1 % GEL Apply 2 g topically 4 (four) times daily.     Marland Kitchen anastrozole (ARIMIDEX) 1 MG tablet TAKE 1 TABLET (1 MG TOTAL) BY MOUTH DAILY. (Patient not taking: Reported on 08/07/2015) 90 tablet 4  . Azelaic Acid (FINACEA EX) Apply topically.     No current facility-administered medications on file prior to visit.     ONCOLOGIC FAMILY HISTORY:  Family History  Problem Relation Age of Onset  . Diabetes Father   . Heart disease Father   . Heart attack Father   . Diabetes Sister   . Diabetes Brother     SOCIAL HISTORY:  SURIYAH VERGARA is widowed and lives alone in [place], [place].  She has four children (including one set of twins) who live close by along with grandchildren. Ms. Gilbertson is currently retired.  She denies any  current or history of tobacco, alcohol, or illicit drug use.     PHYSICAL EXAMINATION:  Vital Signs:   Filed Vitals:   08/07/15 0948  BP: 120/55  Pulse: 97  Temp: 98 F (36.7 C)  Resp: 18   ECOG performance status: 1 General: Well-nourished, well-appearing female in no acute distress.  She is unaccompanied in clinic today.   HEENT: Head is atraumatic and normocephalic.  Pupils equal and reactive to light and accomodation. Conjunctivae clear without exudate.  Sclerae anicteric. Oral mucosa is pink, moist, and intact without lesions.  Oropharynx is pink without lesions or erythema.  Lymph: No cervical, supraclavicular, infraclavicular, or axillary lymphadenopathy noted on palpation.  Cardiovascular: Regular rate and rhythm without murmurs, rubs, or gallops. Respiratory: Clear to auscultation bilaterally. Chest expansion symmetric without accessory muscle use on inspiration or expiration.  Breast: Bilateral breast exam performed.  Post radiation thickening in left  breast without discrete mass or nodule.  Tenderness to palpation along areas of thickening.  No mass or lesion in right breast. GI: Abdomen soft and round. No tenderness to palpation. Bowel sounds normoactive in 4 quadrants. No hepatosplenomegaly.   GU: Deferred.  Musculoskeletal: Muscle strength 5/5 in all extremities.   Neuro: No focal deficits. Steady gait.  Psych: Mood and affect normal and appropriate for situation.  Extremities: No edema, cyanosis, or clubbing.  Skin: Skin changes associated with rosacea across face.  Warm and dry.  LABORATORY DATA:  No results found for this or any previous visit (from the past 2160 hour(s)).  DIAGNOSTIC IMAGING: Bilateral diagnostic mammogram performed on 07/17/2015 reveals stable lumpectomy changes within the left breast with no suspicious mass or malignant microcalcifications in either breast noted.    ASSESSMENT AND PLAN:   1. Breast cancer: History of invasive ductal carcinoma  of the left breast diagnosed in August 2010 with DCIS, ER positive, PR positive, HER2/neu negative, S/P lumpectomy and radiation followed by five years of adjuvant endocrine therapy completed in February 2016.  Ms. Nobile is doing well with no clinical symptoms worrisome for cancer recurrence at this time.  I believe her complaints of cramping in her breast most likely musculoskeletal in nature and we have discussed various interventions to aid with that. I have reviewed the recommendations for ongoing surveillance with her. She will return to the Survivorship clinic in November 2017 for history and physical exam per surveillance protocol.  She was advised to monitor for any worsening in her symptoms or the development of new symptoms prior to that time. She will undergo bilateral diagnostic 3-D mammography in October 2017 prior to her follow-up appointment.  2. Bone health:  Given Ms. Lonsway's age/history of breast cancer and recently completed treatment regimen including anti-estrogen therapy with anastrozole, she is at risk for bone demineralization.  Per our records, her last DEXA scan was in 2015.  We will check this again in October 2017 to ensure that she is not experiencing further demineralization.  In the meantime, she was encouraged to increase her consumption of foods rich in calcium and vitamin D as well as to increase her weight-bearing activities. She was given education on specific activities to promote bone health.  3. Cancer screening:  Due to Ms. Mancil's history and her age, she should receive screening for skin cancers, colon cancer, and gynecologic cancers.  She states that she is due for colonoscopy and upper endoscopy in 2017 and will follow up with her gastroenterologist. The information and recommendations are listed on the patient's comprehensive care plan/treatment summary and were reviewed in detail with the patient.    4. Health maintenance and wellness promotion: Ms. Reder  was encouraged to consume 5-7 servings of fruits and vegetables per day. She was also encouraged to engage in moderate to vigorous exercise for 30 minutes per day most days of the week. She was instructed to limit her alcohol consumption and continue to abstain from tobacco use as part of her recommendations for wellness. Ms. Currington' underwent annual influenza vaccination while in clinic today.    A total of 35 minutes of face-to-face time was spent with this patient with greater than 50% of that time in counseling and care-coordination.   Sylvan Cheese, NP  Survivorship Program [facility] [phone]   Note: PRIMARY CARE PROVIDER Sheela Stack, [place] [phone]

## 2015-08-07 NOTE — Telephone Encounter (Signed)
per pof to sch pt appt-gave pt copy of avs-cld to sch mamma & Dexa

## 2015-08-07 NOTE — Telephone Encounter (Signed)
cld pt and gave pt appt time & date for mamma & DEXA

## 2015-08-07 NOTE — Patient Instructions (Signed)
Thank you for coming in today!  I enjoyed meeting you and look forward to working with you!  Continue to perform your self breast exam and we'll get your mammogram and bone density tests in October 2017.  I will see you back in November 2017 and please call if you have any worsening pain / cramping or new symptoms.  Symptoms to Watch for and Report to Your Provider  . Return of the cancer symptoms you had before- such as a lump or new growth where your cancer first started . New or unusual pain that seems unrelated to an injury and does not go away, including back pain or bone pain . Weight loss without trying/intending . Unexplained bleeding . A rash or allergic reaction, such as swelling, severe itching or wheezing . Chills or fevers . Persistent headaches . Shortness of breath or difficulty breathing . Bloody stools or blood in your urine . Lumps, bumps, swelling and/or nipple discharge . Nausea, vomiting, diarrhea, loss of appetite, or trouble swallowing . A cough that doesn't go away . Abdominal pain . Swelling in your arms or legs . Fractures . Hot flashes or other menopausal symptoms . Any other signs mentioned by your doctor or nurse or any unusual symptoms                 that you just can't explain   NOTE: Just because you have certain symptoms, it doesn't mean the cancer has come back or you have a new cancer. Symptoms can be due to other problems that need to be addressed.  It is important to watch for these symptoms and report them to your provider so you can be medically evaluated for any of these concerns!    Living a Life of Wellness After Cancer:  *Note: Please consult your health care provider before using any medications, supplements, over-the-counter products, or other interventions.  Also, please consult your primary care provider before you begin any lifestyle program (diet, exercise, etc.).  Your safety is our top priority and we want to make sure you continue to live a  long and healthy life!    Healthy Lifestyle Recommendations  As a cancer survivor, it is important develop a lifelong commitment to a healthy lifestyle. A healthy lifestyle can prevent cancer from returning as well as prevent other diseases like heart disease, diabetes and high blood pressure.  These are some things that you can do to have a healthy lifestyle:  Marland Kitchen Maintain a healthy weight.  . Exercise daily per your doctor's orders. . Eat a balanced diet high in fruits, vegetables, bran, and fiber. . Limit how much alcohol you consume, if at all. Ali Lowe regular bone mineral density testing for osteoporosis.  . Talk to your doctor about cardiovascular disease or "heart disease" screening. . Stop smoking (if you smoke). . Know your family history. . Be mindful of your emotional, social, and spiritual needs. . Meet regularly with a Primary Care Provider (PCP). Find a PCP if you do not already have one. . Talk to your doctor about regular cancer screening.

## 2016-02-04 ENCOUNTER — Encounter: Payer: Self-pay | Admitting: Adult Health

## 2016-02-04 NOTE — Progress Notes (Signed)
A birthday card was mailed to the patient today on behalf of the Survivorship Program at [facility].   Gretchen Dawson, NP Survivorship Program [facility] [phone]  

## 2016-06-29 ENCOUNTER — Telehealth: Payer: Self-pay | Admitting: Adult Health

## 2016-06-29 NOTE — Telephone Encounter (Signed)
11/06 Appointment rescheduled to 11/09 per patient request. The patient will not be in town on 11/06.

## 2016-07-16 ENCOUNTER — Other Ambulatory Visit: Payer: Self-pay | Admitting: Nurse Practitioner

## 2016-07-16 ENCOUNTER — Other Ambulatory Visit: Payer: Self-pay | Admitting: Endocrinology

## 2016-07-16 DIAGNOSIS — E2839 Other primary ovarian failure: Secondary | ICD-10-CM

## 2016-07-16 DIAGNOSIS — Z780 Asymptomatic menopausal state: Secondary | ICD-10-CM

## 2016-07-20 ENCOUNTER — Ambulatory Visit
Admission: RE | Admit: 2016-07-20 | Discharge: 2016-07-20 | Disposition: A | Discharge status: Home / Self Care | Payer: Medicare Other | Source: Ambulatory Visit | Attending: Nurse Practitioner | Admitting: Nurse Practitioner

## 2016-07-20 DIAGNOSIS — Z853 Personal history of malignant neoplasm of breast: Secondary | ICD-10-CM

## 2016-07-20 DIAGNOSIS — E2839 Other primary ovarian failure: Secondary | ICD-10-CM

## 2016-07-20 DIAGNOSIS — Z780 Asymptomatic menopausal state: Secondary | ICD-10-CM

## 2016-07-27 ENCOUNTER — Encounter: Payer: Medicare Other | Admitting: Adult Health

## 2016-07-30 ENCOUNTER — Ambulatory Visit (HOSPITAL_BASED_OUTPATIENT_CLINIC_OR_DEPARTMENT_OTHER): Payer: Medicare Other | Admitting: Adult Health

## 2016-07-30 ENCOUNTER — Encounter: Payer: Self-pay | Admitting: Adult Health

## 2016-07-30 VITALS — BP 126/48 | HR 92 | Temp 98.4°F | Resp 18 | Ht 62.0 in | Wt 189.9 lb

## 2016-07-30 DIAGNOSIS — L299 Pruritus, unspecified: Secondary | ICD-10-CM | POA: Diagnosis not present

## 2016-07-30 DIAGNOSIS — Z853 Personal history of malignant neoplasm of breast: Secondary | ICD-10-CM | POA: Diagnosis not present

## 2016-07-30 DIAGNOSIS — R252 Cramp and spasm: Secondary | ICD-10-CM

## 2016-07-30 DIAGNOSIS — Z23 Encounter for immunization: Secondary | ICD-10-CM | POA: Diagnosis not present

## 2016-07-30 DIAGNOSIS — Z1231 Encounter for screening mammogram for malignant neoplasm of breast: Secondary | ICD-10-CM

## 2016-07-30 MED ORDER — INFLUENZA VAC SPLIT QUAD 0.5 ML IM SUSY
0.5000 mL | PREFILLED_SYRINGE | Freq: Once | INTRAMUSCULAR | Status: AC
  Administered 2016-07-30: 0.5 mL via INTRAMUSCULAR
  Filled 2016-07-30: qty 0.5

## 2016-07-30 NOTE — Progress Notes (Signed)
CLINIC:  Survivorship   REASON FOR VISIT:  Routine follow-up for history of breast cancer.   BRIEF ONCOLOGIC HISTORY:  (From [facility], NP's last note on 08/07/15)   INTERVAL HISTORY:  [name] presents to the [facility] today for routine follow-up for her history of breast cancer.  Overall, she reports feeling relatively well.  She completed 5 years of antiestrogen therapy in 10/2014. Her last mammogram was on 07/20/16 and was negative. She also had a DEXA scan at that time.  She reports having some fatigue, but it does not interfere with her activities. She has several sinus issues. She reports having difficulty swallowing at times as well as a dry cough; she tells me she needs to see her GI specialists regarding this. She endorses occasional choking sensation with swallowing. She endorses changes in her stool habits, with more frequent bowel movements then normal. She denies any blood in her stools or melena. She has chronic urge urinary incontinence. She has chronic left breast cramps. She also tells me that her left breast has been itching lately as well. She struggles with back pain, which is also chronic and largely unchanged from previous.  She has not had her annual flu vaccine, and was like to know if she can have it here today.   REVIEW OF SYSTEMS:  Review of Systems  Constitutional: Positive for fatigue.  Respiratory: Positive for cough.   Cardiovascular: Negative.   Gastrointestinal: Negative.   Endocrine: Negative for hot flashes.  Genitourinary: Positive for bladder incontinence.   Musculoskeletal: Positive for back pain.  Skin: Positive for itching.  Neurological: Negative.   Hematological: Negative.   Psychiatric/Behavioral: Negative.   Breast: Left breast cramping and pruritis.    A 14-point review of systems was completed and was negative, except as noted above.    PAST MEDICAL/SURGICAL HISTORY:  Past Medical History:  Diagnosis Date  .  Arthritis   . Arthritis pain   . Cancer ([place]) 04/2009   Left breast cancer  . Cough   . 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 GERD (gastroesophageal reflux disease)   . Hyperlipidemia   . Hypertension   . Nasal congestion   . Sinusitis   . Sleep apnea   . Thyroid disease    Past Surgical History:  Procedure Laterality Date  . BREAST LUMPECTOMY W/ NEEDLE LOCALIZATION Left 06/04/2009  . BREAST SURGERY    . CARPAL TUNNEL RELEASE    . CHOLECYSTECTOMY    . DILATION AND CURETTAGE OF UTERUS    . ROTATOR CUFF REPAIR    . stomach polyps    . TONSILLECTOMY       ALLERGIES:  Allergies  Allergen Reactions  . Sulfa Antibiotics Hives, Itching and Rash  . Doryx [Doxycycline] Swelling  . Exemestane Itching  . Femara [Letrozole] Other (See Comments)    Aching joints   . Penicillins Itching  . Suprax [Cefixime] Itching     CURRENT MEDICATIONS:  Outpatient Encounter Prescriptions as of 07/30/2016  Medication Sig Note  . anastrozole (ARIMIDEX) 1 MG tablet TAKE 1 TABLET (1 MG TOTAL) BY MOUTH DAILY. (Patient not taking: Reported on 08/07/2015)   . Ascorbic Acid (VITAMIN C) 1000 MG tablet Take 1,000 mg by mouth daily.     . Azelaic Acid (FINACEA EX) Apply topically.   . B Complex-C (SUPER B COMPLEX/VITAMIN C PO) Take 1 tablet by mouth daily.    . Calcium Carbonate-Vit D-Min (CALCIUM 1200 PO) Take 1 tablet by mouth daily.   Marland Kitchen  cetirizine (ZYRTEC) 10 MG tablet Take 10 mg by mouth daily as needed.    . clindamycin (CLINDAGEL) 1 % gel Apply topically 2 (two) times daily.   Marland Kitchen FINACEA 15 % cream For  Rosacea on face. 10/25/2014: Received from: External Pharmacy  . levothyroxine (SYNTHROID, LEVOTHROID) 100 MCG tablet Take 100 mcg by mouth daily.     Marland Kitchen losartan-hydrochlorothiazide (HYZAAR) 100-12.5 MG per tablet Take 1 tablet by mouth daily.    . Magnesium 250 MG TABS Take by mouth daily.   . Multiple Vitamins-Minerals (ICAPS MV PO) Take by mouth 2 (two) times daily.   . naproxen (NAPROSYN)  500 MG tablet Take 500 mg by mouth as needed.    . Omega-3 Krill Oil 500 MG CAPS Take 1 capsule by mouth daily.   Marland Kitchen omeprazole (PRILOSEC) 20 MG capsule Take 20 mg by mouth daily.    . VOLTAREN 1 % GEL Apply 2 g topically 4 (four) times daily.     No facility-administered encounter medications on file as of 07/30/2016.      ONCOLOGIC FAMILY HISTORY:  Family History  Problem Relation Age of Onset  . Diabetes Father   . Heart disease Father   . Heart attack Father   . Diabetes Sister   . Diabetes Brother     HEALTH MAINTENANCE:  -Last physical with PCP: 01/2016 -Last colonoscopy: "about 5-6 years ago; I am due now because I have had polyps in my stomach, but have been putting it off because I dread drinking all of that stuff."  -GYN visit: Scheduled to see gynecologist this week, but had to reschedule; annual visit due now -Skin surveillance: Has appt to see her dermatologist next week.    GENETIC COUNSELING/TESTING: No records available for review.   SOCIAL HISTORY:  [name] is widowed and lives alone in [place], [place]. She has 3 children, including a set of twins. She has 4 grandchildren and 1 great-grandson; her [name] is currently 62.79 years old. 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  She denies any current tobacco, alcohol, or illicit drug use.     PHYSICAL EXAMINATION:  Vital Signs: Vitals:   07/30/16 0903  BP: (!) 126/48  Pulse: 92  Resp: 18  Temp: 98.4 F (36.9 C)   Filed Weights   07/30/16 0903  Weight: 189 lb 14.4 oz (86.1 kg)   General: Well-nourished, well-appearing female in no acute distress.  She is unaccompanied today.   HEENT: Head is normocephalic.  Pupils equal and reactive to light. Conjunctivae clear without exudate.  Sclerae anicteric. Oral mucosa is pink, moist.  Oropharynx is pink without lesions or erythema.  Lymph: No cervical, supraclavicular, or  infraclavicular lymphadenopathy noted on palpation.  Cardiovascular: Regular rate and rhythm.Marland Kitchen Respiratory: Clear to auscultation bilaterally. Chest expansion symmetric; breathing non-labored.  Breast Exam:  -Left breast: No appreciable masses on palpation. No skin redness, thickening, or peau d'orange appearance; mild distortion in symmetry at previous lumpectomy site; healed scar without erythema or nodularity. There is no rash or evidence of fungal infection.   -Right breast: No appreciable masses on palpation. No skin redness, thickening, or peau d'orange appearance; no nipple retraction or nipple discharge. -Axilla: No axillary adenopathy bilaterally.  GI: Abdomen soft and round; non-tender, non-distended. Bowel sounds normoactive. No hepatosplenomegaly.   GU: Deferred.  Neuro: No focal deficits. Steady gait.  Psych: Mood and affect normal and appropriate for situation.  Extremities: No edema.  Skin: Warm and dry.  LABORATORY DATA:  None for this visit.   DIAGNOSTIC IMAGING:  Most recent mammogram: 07/20/16   Most recent DEXA scan: 07/20/16   ASSESSMENT AND PLAN:  Ms.. Knouff is a pleasant 81 y.o. female with history of Stage IA left breast invasive ductal carcinoma, ER+/PR+/HER2-, diagnosed in 04/2009; treated with lumpectomy, adjuvant radiation therapy, and anti-estrogen therapy for total of 5 years; completed anti-estrogen therapy in 10/2014.  She presents to the [facility] for surveillance and routine follow-up.   1. History of Stage IA left breast cancer:  Ms. Glasco is currently clinically and radiographically without evidence of disease or recurrence of breast cancer. She will be due for annual mammogram in 06/2017; she understands that since it has been over 7 years since her lumpectomy, she will now need annual screening mammograms rather than diagnostic mammograms; orders placed today for bilateral screening mammogram. As of right now, she will return to the cancer  center to see Survivorship NP in 07/2017.  We discussed the option of having her "graduate" from follow-up at the cancer center, given that she is doing very well without any evidence of recurrence. She will think about this option and either call us to let us know, or she can decide at her visit next year. I encouraged her to call me with any questions or concerns before her next visit cancer center, and I would be happy to see her sooner if needed.   2. Left breast cramping/pruritis: Her reported left breast cramping is chronic, and largely unchanged. Her last mammogram was on 07/21/16 and was negative for malignancy, which is reassuring. The pruritus noted to her left breast has no physical exam correlate. I encouraged her to apply lotion with vitamin E, as the skin is dry and may be contributing to her itching.  3. Intermittent dysphagia/Dry cough: We discussed that this could be secondary to GERD. She does have a GI specialist, and tells me that she is due for her endoscopy and colonoscopy evaluations. She has been "putting off" getting these test scheduled, and she does not like the prep for the procedures. I strongly recommended that she call her GI specialist, particularly given her symptoms, as it is important to have these evaluated sooner rather than later. She agreed with this plan.  4. Bone health:  Given Ms. Carrero's age, history of breast cancer, and her history of anti-estrogen therapy with aromatase inhibitors, she is at risk for bone demineralization. Her last DEXA scan was on 07/20/16 and showed osteopenia, T-score -2.2.  She understands that she is close to the osteoporosis category, but does not want to start any bisphosphonate therapy.  I think this is reasonable for now.  She does currently take calcium and vitamin D, which I encouraged her to continue to do. I also recommended that she increase her weight-bearing activities.  She was given education on specific food and activities to  promote bone health.  5. Cancer screening:  Due to Ms. Brunton's history and her age, she should receive screening for skin cancers, colon cancer, and gynecologic cancers. She was encouraged to follow-up with her PCP for appropriate cancer screenings. I recommended she call her GI specialist to get her colonoscopy/endoscopy scheduled as soon as she is able.   6. Annual flu vaccine: Annual flu vaccine administered today in clinic.    Dispo:  -Annual mammogram due 06/2017; order placed today.  -Return to cancer center to see Survivorship NP in 07/2017.   A total  of 30 minutes of face-to-face time was spent with this patient with greater than 50% of that time in counseling and care-coordination.   Mike Craze, NP Survivorship Program [place] [phone]   Note: PRIMARY CARE PROVIDER Sheela Stack, [place] [phone]

## 2016-08-05 ENCOUNTER — Encounter: Payer: Medicare Other | Admitting: Nurse Practitioner

## 2017-04-07 ENCOUNTER — Other Ambulatory Visit: Payer: Self-pay | Admitting: Gastroenterology

## 2017-04-07 DIAGNOSIS — R1310 Dysphagia, unspecified: Secondary | ICD-10-CM

## 2017-04-09 ENCOUNTER — Ambulatory Visit
Admission: RE | Admit: 2017-04-09 | Discharge: 2017-04-09 | Disposition: A | Discharge status: Home / Self Care | Payer: Medicare Other | Source: Ambulatory Visit | Attending: Gastroenterology | Admitting: Gastroenterology

## 2017-04-09 DIAGNOSIS — R1310 Dysphagia, unspecified: Secondary | ICD-10-CM

## 2017-07-21 ENCOUNTER — Ambulatory Visit
Admission: RE | Admit: 2017-07-21 | Discharge: 2017-07-21 | Disposition: A | Discharge status: Home / Self Care | Payer: Medicare Other | Source: Ambulatory Visit | Attending: Adult Health | Admitting: Adult Health

## 2017-07-21 DIAGNOSIS — Z1231 Encounter for screening mammogram for malignant neoplasm of breast: Secondary | ICD-10-CM

## 2017-07-21 HISTORY — DX: Personal history of irradiation: Z92.3

## 2017-07-21 HISTORY — DX: Malignant neoplasm of unspecified site of unspecified female breast: C50.919

## 2017-07-30 ENCOUNTER — Encounter: Payer: Medicare Other | Admitting: Adult Health

## 2017-08-03 ENCOUNTER — Encounter: Payer: Self-pay | Admitting: Adult Health

## 2017-08-03 ENCOUNTER — Ambulatory Visit: Payer: Medicare Other | Admitting: Adult Health

## 2017-08-03 ENCOUNTER — Telehealth: Payer: Self-pay

## 2017-08-03 VITALS — BP 122/66 | HR 87 | Temp 98.3°F | Resp 20 | Ht 62.0 in | Wt 184.9 lb

## 2017-08-03 DIAGNOSIS — Z853 Personal history of malignant neoplasm of breast: Secondary | ICD-10-CM

## 2017-08-03 DIAGNOSIS — Z1239 Encounter for other screening for malignant neoplasm of breast: Secondary | ICD-10-CM

## 2017-08-03 NOTE — Progress Notes (Signed)
CLINIC:  Survivorship   REASON FOR VISIT:  Routine follow-up for history of breast cancer.   BRIEF ONCOLOGIC HISTORY:  Per Mikey Bussing note from 02/2014 1.  Status post left breast needle core biopsy at the 6 o'clock position on 05/10/2009 which showed invasive mammary carcinoma, ER 100%, PR 13%, Ki-67 14%, HER2/neu negative with a ratio of 1.38.  2.  Status post left breast needle/wire localized lumpectomy with sentinel node biopsy on 06/04/2009 for a stage I, pT1c pN0, 1.5 cm invasive ductal carcinoma and DCIS, grade 2, ER 100%, PR 13%, Ki-6714%, HER2/neu negative with a ratio of 1.38, 0/2 positive lymph nodes.  3.  Status post radiation therapy from 07/22/2009 through 08/20/2009.  4.  The patient started antiestrogen therapy with Arimidex in 11/2009.  Arimidex was discontinued due to patient  intolerance.  The patient was then started on antiestrogen therapy Femara.  Femara caused her to have aching joints and was discontinued.  The patient then tried antiestrogen therapy with Aromasin which caused her severe pruritus and was discontinued.  The patient then went back to antiestrogen therapy with Arimidex. She completed therapy in 2017.   INTERVAL HISTORY:  [name] presents to the Survivorship Clinic today for routine follow-up for her history of breast cancer.  Overall, she reports feeling quite well. She has some arthritis and is managing that with her PCP.  She does not exercise due to fear of danger.  She does not feel safe walking by herself because other people are mean.      REVIEW OF SYSTEMS:  Review of Systems  Constitutional: Negative for appetite change, chills, fatigue, fever and unexpected weight change.  HENT:   Negative for hearing loss and lump/mass.   Eyes: Negative for eye problems and icterus.  Respiratory: Negative for chest tightness, cough, shortness of breath and wheezing.   Cardiovascular: Negative for chest pain, leg swelling and palpitations.    Gastrointestinal: Negative for abdominal distention, abdominal pain, constipation, diarrhea, nausea and vomiting.  Endocrine: Negative for hot flashes.  Skin: Negative for itching.  Neurological: Negative for dizziness, extremity weakness and headaches.  Hematological: Negative for adenopathy. Does not bruise/bleed easily.  Psychiatric/Behavioral: Negative for depression. The patient is not nervous/anxious.   Breast: Denies any new nodularity, masses, tenderness, nipple changes, or nipple discharge.       PAST MEDICAL/SURGICAL HISTORY:  Past Medical History:  Diagnosis Date  . Arthritis   . Arthritis pain   . Breast cancer ([place]) 2010   left breast  . Cancer ([place]) 04/2009   Left breast cancer  . Cough   . 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 GERD (gastroesophageal reflux disease)   . Hyperlipidemia   . Hypertension   . Nasal congestion   . Personal history of radiation therapy   . Sinusitis   . Sleep apnea   . Thyroid disease    Past Surgical History:  Procedure Laterality Date  . BREAST LUMPECTOMY Left 2010  . BREAST LUMPECTOMY W/ NEEDLE LOCALIZATION Left 06/04/2009  . BREAST SURGERY    . CARPAL TUNNEL RELEASE    . CHOLECYSTECTOMY    . DILATION AND CURETTAGE OF UTERUS    . ROTATOR CUFF REPAIR    . stomach polyps    . TONSILLECTOMY       ALLERGIES:  Allergies  Allergen Reactions  . Sulfa Antibiotics Hives, Itching and Rash  . Doryx [Doxycycline] Swelling  . Exemestane Itching  . Femara [Letrozole] Other (See Comments)    Aching joints   .  Penicillins Itching  . Suprax [Cefixime] Itching     CURRENT MEDICATIONS:  Outpatient Encounter Medications as of 08/03/2017  Medication Sig Note  . Ascorbic Acid (VITAMIN C) 1000 MG tablet Take 1,000 mg by mouth daily.     . Calcium Carb-Cholecalciferol [phone] MG-UNIT TABS Take by mouth. 07/30/2016: Received from: [facility] Received Sig: Take 1 tablet by mouth.  . Calcium Carbonate-Vit  D-Min (CALCIUM 1200 PO) Take 1 tablet by mouth daily.   . cetirizine (ZYRTEC) 10 MG tablet Take by mouth. 07/30/2016: Received from: [facility] Received Sig: Take by mouth.  . Cholecalciferol (VITAMIN D PO) Take 5,000 mg by mouth daily.   . clindamycin (CLINDAGEL) 1 % gel Apply topically 2 (two) times daily.   Marland Kitchen FINACEA 15 % cream For  Rosacea on face. 10/25/2014: Received from: External Pharmacy  . levothyroxine (SYNTHROID, LEVOTHROID) 100 MCG tablet Take 100 mcg by mouth daily.     Marland Kitchen losartan-hydrochlorothiazide (HYZAAR) 100-12.5 MG per tablet Take 1 tablet by mouth daily.    . Magnesium 250 MG TABS Take by mouth daily.   . methocarbamol (ROBAXIN) 500 MG tablet Take 500 mg as needed by mouth for muscle spasms.   . niacin 500 MG tablet Take 500 mg by mouth daily.   . Omega-3 Krill Oil 500 MG CAPS Take 1 capsule by mouth daily.   Marland Kitchen omeprazole (PRILOSEC) 20 MG capsule Take 20 mg by mouth daily.    . VOLTAREN 1 % GEL Apply 2 g topically 4 (four) times daily.    . [DISCONTINUED] fenofibrate 160 MG tablet TAKE ONE TABLET BY MOUTH DAILY 07/30/2016: Received from: [facility]  . [DISCONTINUED] Multiple Vitamins-Minerals (ICAPS MV PO) Take by mouth 2 (two) times daily.   . [DISCONTINUED] Pseudoephedrine-Guaifenesin (MUCINEX D PO) Take by mouth.    No facility-administered encounter medications on file as of 08/03/2017.      ONCOLOGIC FAMILY HISTORY:  Family History  Problem Relation Age of Onset  . Diabetes Father   . Heart disease Father   . Heart attack Father   . Diabetes Sister   . Diabetes Brother     GENETIC COUNSELING/TESTING:   SOCIAL HISTORY:  PENIEL BIEL is widowed and lives alone in [place], [place].  She has  2 children who live in the area and one who lives on the [place].  Ms. Massimino is currently retired.  She denies any current or history of tobacco, alcohol, or illicit drug use.     PHYSICAL EXAMINATION:  Vital  Signs: Vitals:   08/03/17 1118  BP: 122/66  Pulse: 87  Resp: 20  Temp: 98.3 F (36.8 C)  SpO2: 97%   Filed Weights   08/03/17 1118  Weight: 184 lb 14.4 oz (83.9 kg)   General: Well-nourished, well-appearing female in no acute distress.  Unaccompanied today.   HEENT: Head is normocephalic.  Pupils equal and reactive to light. Conjunctivae clear without exudate.  Sclerae anicteric. Oral mucosa is pink, moist.  Oropharynx is pink without lesions or erythema.  Lymph: No cervical, supraclavicular, or infraclavicular lymphadenopathy noted on palpation.  Cardiovascular: Regular rate and rhythm.Marland Kitchen Respiratory: Clear to auscultation bilaterally. Chest expansion symmetric; breathing non-labored.  Breast Exam:  -Left breast: No appreciable masses on palpation. No skin redness, thickening, or peau d'orange appearance; no nipple retraction or nipple discharge; moderate distortion in symmetry at previous lumpectomy site well healed scar without erythema or nodularity.  -Right breast: No appreciable  masses on palpation. No skin redness, thickening, or peau d'orange appearance; no nipple retraction or nipple discharge;  -Axilla: No axillary adenopathy bilaterally.  GI: Abdomen soft and round; non-tender, non-distended. Bowel sounds normoactive. No hepatosplenomegaly.   GU: Deferred.  Neuro: No focal deficits. Steady gait.  Psych: Mood and affect normal and appropriate for situation.  MSK: No focal spinal tenderness to palpation, full range of motion in bilateral upper extremities Extremities: No edema. Skin: Warm and dry.  LABORATORY DATA:  None for this visit   DIAGNOSTIC IMAGING:  Most recent mammogram:      ASSESSMENT AND PLAN:  Ms.. Bernales is a pleasant 82 y.o. female with history of Stage IA left breast invasive ductal carcinoma, ER+/PR+/HER2-, diagnosed in 04/2009, treated with lumpectomy, adjuvant radiation therapy, and anti-estrogen x 5 years completing in 10/2014.  She presents to the  [facility]vivorship Clinic for surveillance and routine follow-up.   1. History of breast cancer:  Ms. Kretchmer is currently clinically and radiographically without evidence of disease or recurrence of breast cancer. She will be due for mammogram in 06/2018; orders placed today.  As she is now more than 5 years out from her initial diagnosis and has completed 5 years of anti estrogen therapy, I recommended that she f/u for surveillance with her GYN annually with breast exam and continue receiving annual 3d mammograms.  She is in agreement with this plan.    2. Bone health:  Given Ms. Brizuela's age, history of breast cancer, and her previous anti-estrogen therapy with Anastrozole, she is at risk for bone demineralization. Her last DEXA scan was on 07/20/2016 and was consistent with osteopenia in her left femur with a T score of -2.2.  In the meantime, she was encouraged to increase her consumption of foods rich in calcium, as well as increase her weight-bearing activities.  She was given education on specific food and activities to promote bone health.  3. Cancer screening:  Due to Ms. Shreiner's history and her age, she should receive screening for skin cancers, colon cancer (if indicated at this point by GI), and gynecologic cancers (as per GYN). She was encouraged to follow-up with her PCP for appropriate cancer screenings.   4. Health maintenance and wellness promotion: Ms. Leckrone was encouraged to consume 5-7 servings of fruits and vegetables per day. She was also encouraged to engage in moderate to vigorous exercise for 30 minutes per day most days of the week. She was instructed to limit her alcohol consumption and continue to abstain from tobacco use.    Dispo:  -Return to cancer center PRN -Mammogram 07/21/2018 -Bone Density 07/20/2018   A total of (30) minutes of face-to-face time was spent with this patient with greater than 50% of that time in counseling and care-coordination.   Gardenia Phlegm, NP Survivorship Program [facility] [phone]   Note: PRIMARY CARE PROVIDER Reynold Bowen, [place] [phone]

## 2017-08-03 NOTE — Telephone Encounter (Signed)
No los per 11/13 DAR

## 2018-05-24 ENCOUNTER — Telehealth: Payer: Self-pay | Admitting: Adult Health

## 2018-05-24 NOTE — Telephone Encounter (Signed)
Faxed medical records to [facility]: [employer], Release ID: [id number]

## 2018-07-22 ENCOUNTER — Ambulatory Visit
Admission: RE | Admit: 2018-07-22 | Discharge: 2018-07-22 | Disposition: A | Discharge status: Home / Self Care | Payer: Medicare Other | Source: Ambulatory Visit | Attending: Adult Health | Admitting: Adult Health

## 2018-07-22 DIAGNOSIS — Z1239 Encounter for other screening for malignant neoplasm of breast: Secondary | ICD-10-CM

## 2019-05-15 ENCOUNTER — Other Ambulatory Visit: Payer: Self-pay | Admitting: Endocrinology

## 2019-05-15 DIAGNOSIS — Z1231 Encounter for screening mammogram for malignant neoplasm of breast: Secondary | ICD-10-CM

## 2019-07-26 ENCOUNTER — Ambulatory Visit
Admission: RE | Admit: 2019-07-26 | Discharge: 2019-07-26 | Disposition: A | Discharge status: Home / Self Care | Payer: Medicare Other | Source: Ambulatory Visit | Attending: Endocrinology | Admitting: Endocrinology

## 2019-07-26 ENCOUNTER — Other Ambulatory Visit: Payer: Self-pay

## 2019-07-26 DIAGNOSIS — Z1231 Encounter for screening mammogram for malignant neoplasm of breast: Secondary | ICD-10-CM

## 2019-08-10 ENCOUNTER — Emergency Department (HOSPITAL_COMMUNITY): Payer: Medicare Other

## 2019-08-10 ENCOUNTER — Inpatient Hospital Stay (HOSPITAL_COMMUNITY)
Admission: EM | Admit: 2019-08-10 | Discharge: 2019-08-12 | DRG: 314 | Disposition: A | Discharge status: Home / Self Care | Payer: Medicare Other | Attending: Internal Medicine | Admitting: Internal Medicine

## 2019-08-10 ENCOUNTER — Other Ambulatory Visit: Payer: Self-pay

## 2019-08-10 ENCOUNTER — Encounter (HOSPITAL_COMMUNITY): Payer: Self-pay | Admitting: Emergency Medicine

## 2019-08-10 DIAGNOSIS — G4730 Sleep apnea, unspecified: Secondary | ICD-10-CM | POA: Diagnosis present

## 2019-08-10 DIAGNOSIS — N179 Acute kidney failure, unspecified: Secondary | ICD-10-CM

## 2019-08-10 DIAGNOSIS — D696 Thrombocytopenia, unspecified: Secondary | ICD-10-CM | POA: Diagnosis present

## 2019-08-10 DIAGNOSIS — R42 Dizziness and giddiness: Secondary | ICD-10-CM

## 2019-08-10 DIAGNOSIS — I9589 Other hypotension: Principal | ICD-10-CM | POA: Diagnosis present

## 2019-08-10 DIAGNOSIS — H6123 Impacted cerumen, bilateral: Secondary | ICD-10-CM | POA: Diagnosis not present

## 2019-08-10 DIAGNOSIS — Z6834 Body mass index (BMI) 34.0-34.9, adult: Secondary | ICD-10-CM

## 2019-08-10 DIAGNOSIS — Z20828 Contact with and (suspected) exposure to other viral communicable diseases: Secondary | ICD-10-CM | POA: Diagnosis present

## 2019-08-10 DIAGNOSIS — Z8249 Family history of ischemic heart disease and other diseases of the circulatory system: Secondary | ICD-10-CM

## 2019-08-10 DIAGNOSIS — U071 COVID-19: Secondary | ICD-10-CM

## 2019-08-10 DIAGNOSIS — Z853 Personal history of malignant neoplasm of breast: Secondary | ICD-10-CM

## 2019-08-10 DIAGNOSIS — Z79890 Hormone replacement therapy (postmenopausal): Secondary | ICD-10-CM

## 2019-08-10 DIAGNOSIS — I959 Hypotension, unspecified: Secondary | ICD-10-CM

## 2019-08-10 DIAGNOSIS — I10 Essential (primary) hypertension: Secondary | ICD-10-CM | POA: Diagnosis present

## 2019-08-10 DIAGNOSIS — K219 Gastro-esophageal reflux disease without esophagitis: Secondary | ICD-10-CM

## 2019-08-10 DIAGNOSIS — M1990 Unspecified osteoarthritis, unspecified site: Secondary | ICD-10-CM | POA: Diagnosis present

## 2019-08-10 DIAGNOSIS — H6120 Impacted cerumen, unspecified ear: Secondary | ICD-10-CM | POA: Diagnosis present

## 2019-08-10 DIAGNOSIS — E669 Obesity, unspecified: Secondary | ICD-10-CM | POA: Diagnosis present

## 2019-08-10 DIAGNOSIS — Z833 Family history of diabetes mellitus: Secondary | ICD-10-CM

## 2019-08-10 DIAGNOSIS — Z881 Allergy status to other antibiotic agents status: Secondary | ICD-10-CM

## 2019-08-10 DIAGNOSIS — Z888 Allergy status to other drugs, medicaments and biological substances status: Secondary | ICD-10-CM

## 2019-08-10 DIAGNOSIS — Z923 Personal history of irradiation: Secondary | ICD-10-CM

## 2019-08-10 DIAGNOSIS — E039 Hypothyroidism, unspecified: Secondary | ICD-10-CM

## 2019-08-10 DIAGNOSIS — Z882 Allergy status to sulfonamides status: Secondary | ICD-10-CM

## 2019-08-10 DIAGNOSIS — E861 Hypovolemia: Secondary | ICD-10-CM

## 2019-08-10 DIAGNOSIS — E860 Dehydration: Secondary | ICD-10-CM | POA: Diagnosis present

## 2019-08-10 DIAGNOSIS — D649 Anemia, unspecified: Secondary | ICD-10-CM | POA: Diagnosis present

## 2019-08-10 DIAGNOSIS — E871 Hypo-osmolality and hyponatremia: Secondary | ICD-10-CM | POA: Diagnosis present

## 2019-08-10 DIAGNOSIS — E785 Hyperlipidemia, unspecified: Secondary | ICD-10-CM | POA: Diagnosis present

## 2019-08-10 LAB — BASIC METABOLIC PANEL WITH GFR
Anion gap: 12 (ref 5–15)
BUN: 19 mg/dL (ref 8–23)
CO2: 26 mmol/L (ref 22–32)
Calcium: 9.1 mg/dL (ref 8.9–10.3)
Chloride: 96 mmol/L — ABNORMAL LOW (ref 98–111)
Creatinine, Ser: 1.32 mg/dL — ABNORMAL HIGH (ref 0.44–1.00)
GFR calc Af Amer: 43 mL/min — ABNORMAL LOW
GFR calc non Af Amer: 37 mL/min — ABNORMAL LOW
Glucose, Bld: 145 mg/dL — ABNORMAL HIGH (ref 70–99)
Potassium: 4.4 mmol/L (ref 3.5–5.1)
Sodium: 134 mmol/L — ABNORMAL LOW (ref 135–145)

## 2019-08-10 LAB — URINALYSIS, ROUTINE W REFLEX MICROSCOPIC
Bilirubin Urine: NEGATIVE
Glucose, UA: NEGATIVE mg/dL
Hgb urine dipstick: NEGATIVE
Ketones, ur: NEGATIVE mg/dL
Nitrite: NEGATIVE
Protein, ur: NEGATIVE mg/dL
Specific Gravity, Urine: 1.01 (ref 1.005–1.030)
pH: 6 (ref 5.0–8.0)

## 2019-08-10 LAB — CBC WITH DIFFERENTIAL/PLATELET
Abs Immature Granulocytes: 0.04 K/uL (ref 0.00–0.07)
Basophils Absolute: 0 K/uL (ref 0.0–0.1)
Basophils Relative: 1 %
Eosinophils Absolute: 0 K/uL (ref 0.0–0.5)
Eosinophils Relative: 0 %
HCT: 40.3 % (ref 36.0–46.0)
Hemoglobin: 12.3 g/dL (ref 12.0–15.0)
Immature Granulocytes: 1 %
Lymphocytes Relative: 13 %
Lymphs Abs: 0.9 K/uL (ref 0.7–4.0)
MCH: 28.1 pg (ref 26.0–34.0)
MCHC: 30.5 g/dL (ref 30.0–36.0)
MCV: 92 fL (ref 80.0–100.0)
Monocytes Absolute: 0.8 K/uL (ref 0.1–1.0)
Monocytes Relative: 12 %
Neutro Abs: 4.9 K/uL (ref 1.7–7.7)
Neutrophils Relative %: 73 %
Platelets: 132 K/uL — ABNORMAL LOW (ref 150–400)
RBC: 4.38 MIL/uL (ref 3.87–5.11)
RDW: 14.9 % (ref 11.5–15.5)
WBC: 6.6 K/uL (ref 4.0–10.5)
nRBC: 0 % (ref 0.0–0.2)

## 2019-08-10 LAB — CBG MONITORING, ED: Glucose-Capillary: 125 mg/dL — ABNORMAL HIGH (ref 70–99)

## 2019-08-10 LAB — LACTIC ACID, PLASMA
Lactic Acid, Venous: 1.7 mmol/L (ref 0.5–1.9)
Lactic Acid, Venous: 1.7 mmol/L (ref 0.5–1.9)

## 2019-08-10 LAB — SODIUM, URINE, RANDOM: Sodium, Ur: 149 mmol/L

## 2019-08-10 LAB — SARS CORONAVIRUS 2 (TAT 6-24 HRS): SARS Coronavirus 2: POSITIVE — AB

## 2019-08-10 LAB — HEPATITIS B SURFACE ANTIGEN: Hepatitis B Surface Ag: NONREACTIVE

## 2019-08-10 LAB — ABO/RH: ABO/RH(D): O POS

## 2019-08-10 LAB — CREATININE, URINE, RANDOM: Creatinine, Urine: 45.41 mg/dL

## 2019-08-10 LAB — TSH: TSH: 0.275 u[IU]/mL — ABNORMAL LOW (ref 0.350–4.500)

## 2019-08-10 MED ORDER — DOCUSATE SODIUM 50 MG/5ML PO LIQD
50.0000 mg | Freq: Once | ORAL | Status: DC
  Filled 2019-08-10: qty 10

## 2019-08-10 MED ORDER — ONDANSETRON HCL 4 MG/2ML IJ SOLN: 4.0000 mg | Freq: Four times a day (QID) | INTRAMUSCULAR | Status: DC | PRN

## 2019-08-10 MED ORDER — ALBUTEROL SULFATE HFA 108 (90 BASE) MCG/ACT IN AERS
2.0000 | INHALATION_SPRAY | Freq: Four times a day (QID) | RESPIRATORY_TRACT | Status: DC | PRN
  Filled 2019-08-10: qty 6.7

## 2019-08-10 MED ORDER — SODIUM CHLORIDE 0.9 % IV BOLUS
500.0000 mL | Freq: Once | INTRAVENOUS | Status: AC
  Administered 2019-08-10: 500 mL via INTRAVENOUS

## 2019-08-10 MED ORDER — SODIUM CHLORIDE 0.9 % IV SOLN
INTRAVENOUS | Status: DC
  Administered 2019-08-10: 14:00:00 via INTRAVENOUS

## 2019-08-10 MED ORDER — VITAMIN C 500 MG PO TABS
500.0000 mg | ORAL_TABLET | Freq: Every day | ORAL | Status: DC
  Administered 2019-08-10 – 2019-08-12 (×3): 500 mg via ORAL
  Filled 2019-08-10 (×3): qty 1

## 2019-08-10 MED ORDER — METHOCARBAMOL 500 MG PO TABS: 500.0000 mg | ORAL_TABLET | Freq: Every day | ORAL | Status: DC | PRN

## 2019-08-10 MED ORDER — LORATADINE 10 MG PO TABS
10.0000 mg | ORAL_TABLET | Freq: Every day | ORAL | Status: DC
  Administered 2019-08-12: 10 mg via ORAL
  Filled 2019-08-10 (×3): qty 1

## 2019-08-10 MED ORDER — SODIUM CHLORIDE 0.9% FLUSH
3.0000 mL | Freq: Two times a day (BID) | INTRAVENOUS | Status: DC
  Administered 2019-08-11 – 2019-08-12 (×2): 3 mL via INTRAVENOUS

## 2019-08-10 MED ORDER — HYDROCOD POLST-CPM POLST ER 10-8 MG/5ML PO SUER: 5.0000 mL | Freq: Two times a day (BID) | ORAL | Status: DC | PRN

## 2019-08-10 MED ORDER — MAGNESIUM OXIDE 400 (241.3 MG) MG PO TABS
400.0000 mg | ORAL_TABLET | Freq: Every day | ORAL | Status: DC
  Administered 2019-08-10 – 2019-08-12 (×3): 400 mg via ORAL
  Filled 2019-08-10 (×2): qty 1

## 2019-08-10 MED ORDER — POLYVINYL ALCOHOL 1.4 % OP SOLN
1.0000 [drp] | OPHTHALMIC | Status: DC | PRN
  Administered 2019-08-10: 1 [drp] via OPHTHALMIC
  Filled 2019-08-10: qty 15

## 2019-08-10 MED ORDER — ENOXAPARIN SODIUM 40 MG/0.4ML ~~LOC~~ SOLN
40.0000 mg | SUBCUTANEOUS | Status: DC
  Administered 2019-08-10 – 2019-08-12 (×3): 40 mg via SUBCUTANEOUS
  Filled 2019-08-10 (×3): qty 0.4

## 2019-08-10 MED ORDER — ACETAMINOPHEN 325 MG PO TABS
650.0000 mg | ORAL_TABLET | Freq: Four times a day (QID) | ORAL | Status: DC | PRN
  Administered 2019-08-11: 650 mg via ORAL
  Filled 2019-08-10: qty 2

## 2019-08-10 MED ORDER — ALBUTEROL SULFATE (2.5 MG/3ML) 0.083% IN NEBU: 2.5000 mg | INHALATION_SOLUTION | Freq: Four times a day (QID) | RESPIRATORY_TRACT | Status: DC | PRN

## 2019-08-10 MED ORDER — ZINC SULFATE 220 (50 ZN) MG PO CAPS
220.0000 mg | ORAL_CAPSULE | Freq: Every day | ORAL | Status: DC
  Administered 2019-08-10 – 2019-08-12 (×3): 220 mg via ORAL
  Filled 2019-08-10 (×3): qty 1

## 2019-08-10 MED ORDER — GUAIFENESIN-DM 100-10 MG/5ML PO SYRP
5.0000 mL | ORAL_SOLUTION | ORAL | Status: DC | PRN
  Filled 2019-08-10: qty 5

## 2019-08-10 MED ORDER — LEVOTHYROXINE SODIUM 100 MCG PO TABS
100.0000 ug | ORAL_TABLET | Freq: Every day | ORAL | Status: DC
  Administered 2019-08-11 – 2019-08-12 (×2): 100 ug via ORAL
  Filled 2019-08-10 (×2): qty 1

## 2019-08-10 MED ORDER — DICLOFENAC SODIUM 1 % EX GEL
2.0000 g | Freq: Four times a day (QID) | CUTANEOUS | Status: DC | PRN
  Administered 2019-08-10: 2 g via TOPICAL
  Filled 2019-08-10 (×2): qty 100

## 2019-08-10 MED ORDER — PANTOPRAZOLE SODIUM 40 MG PO TBEC
40.0000 mg | DELAYED_RELEASE_TABLET | Freq: Every day | ORAL | Status: DC
  Administered 2019-08-10 – 2019-08-12 (×3): 40 mg via ORAL
  Filled 2019-08-10 (×3): qty 1

## 2019-08-10 MED ORDER — SODIUM CHLORIDE 0.9 % IV BOLUS
1000.0000 mL | Freq: Once | INTRAVENOUS | Status: AC
  Administered 2019-08-10: 1000 mL via INTRAVENOUS

## 2019-08-10 MED ORDER — ONDANSETRON HCL 4 MG PO TABS: 4.0000 mg | ORAL_TABLET | Freq: Four times a day (QID) | ORAL | Status: DC | PRN

## 2019-08-10 MED ORDER — SODIUM CHLORIDE 0.9 % IV SOLN
Freq: Once | INTRAVENOUS | Status: AC
  Administered 2019-08-10: 22:00:00 via INTRAVENOUS

## 2019-08-10 MED ORDER — CARBAMIDE PEROXIDE 6.5 % OT SOLN
5.0000 [drp] | Freq: Two times a day (BID) | OTIC | Status: DC
  Administered 2019-08-10 – 2019-08-12 (×2): 5 [drp] via OTIC
  Filled 2019-08-10: qty 15

## 2019-08-10 MED ORDER — MAGNESIUM 200 MG PO TABS
500.0000 mg | ORAL_TABLET | Freq: Every day | ORAL | Status: DC
  Filled 2019-08-10: qty 2
  Filled 2019-08-10 (×2): qty 3

## 2019-08-10 MED ORDER — ACETAMINOPHEN 650 MG RE SUPP: 650.0000 mg | Freq: Four times a day (QID) | RECTAL | Status: DC | PRN

## 2019-08-10 NOTE — ED Provider Notes (Signed)
[facility] EMERGENCY DEPARTMENT Provider Note   CSN: [id number] Arrival date & time: 08/10/19  [id number]     History   Chief Complaint Chief Complaint  Patient presents with  . Weakness    HPI [name] is a 84 y.o. female.     84 y.o female with a PMH of HTN, Thyroid disease, GERD presents to the ED with a chief complaint of dizziness. Patient reports symptoms began this morning while she was cooking at church.She states the dizziness happens when she stands does get exacerbated with changing of position and movement. Improved with lying down. She reports having bad allergies for the last couple of days reports taking two 24 hour allergy pill within a 24 hour span. She also endorses chills which began yesterday but only lasted for a few hours. She Denies any chest pain, shortness of breath, abdominal pain, headache or numbness.   The history is provided by the patient and medical records.  Weakness Severity:  Mild Associated symptoms: dizziness   Associated symptoms: no abdominal pain, no chest pain, no fever, no headaches, no nausea, no seizures, no shortness of breath and no vomiting     Past Medical History:  Diagnosis Date  . Arthritis   . Arthritis pain   . Breast cancer ([place]) 2010   left breast  . Cancer ([place]) 04/2009   Left breast cancer  . Cough   . 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 GERD (gastroesophageal reflux disease)   . Hyperlipidemia   . Hypertension   . Nasal congestion   . Personal history of radiation therapy   . Sinusitis   . Sleep apnea   . Thyroid disease     Patient Active Problem List   Diagnosis Date Noted  . Osteopenia 02/20/2014  . History of breast cancer, left, T1c,N0, lumpectomy 06/04/2009. 09/24/2011    Past Surgical History:  Procedure Laterality Date  . BREAST BIOPSY    . BREAST LUMPECTOMY Left 2010  . BREAST LUMPECTOMY W/ NEEDLE LOCALIZATION Left 06/04/2009  . BREAST SURGERY    . CARPAL TUNNEL  RELEASE    . CHOLECYSTECTOMY    . DILATION AND CURETTAGE OF UTERUS    . ROTATOR CUFF REPAIR    . stomach polyps    . TONSILLECTOMY       OB History   No obstetric history on file.      Home Medications    Prior to Admission medications   Medication Sig Start Date End Date Taking? Authorizing Provider  Ascorbic Acid (VITAMIN C) 1000 MG tablet Take 1,000 mg by mouth daily.      [provider]  Calcium Carb-Cholecalciferol [phone] MG-UNIT TABS Take by mouth.    [provider]  Calcium Carbonate-Vit D-Min (CALCIUM 1200 PO) Take 1 tablet by mouth daily.    [provider]  cetirizine (ZYRTEC) 10 MG tablet Take by mouth.    [provider]  Cholecalciferol (VITAMIN D PO) Take 5,000 mg by mouth daily.    [provider]  clindamycin (CLINDAGEL) 1 % gel Apply topically 2 (two) times daily.    [provider]  FINACEA 15 % cream For  Rosacea on face. 08/06/14   [provider]  levothyroxine (SYNTHROID, LEVOTHROID) 100 MCG tablet Take 100 mcg by mouth daily.      [provider]  losartan-hydrochlorothiazide (HYZAAR) 100-12.5 MG per tablet Take 1 tablet by mouth daily.  11/13/12   [provider]  Magnesium 250  MG TABS Take by mouth daily.    [provider]  methocarbamol (ROBAXIN) 500 MG tablet Take 500 mg as needed by mouth for muscle spasms.    [provider]  niacin 500 MG tablet Take 500 mg by mouth daily.    [provider]  Omega-3 Krill Oil 500 MG CAPS Take 1 capsule by mouth daily.    [provider]  omeprazole (PRILOSEC) 20 MG capsule Take 20 mg by mouth daily.     [provider]  VOLTAREN 1 % GEL Apply 2 g topically 4 (four) times daily.  12/30/12   [provider]    Family History Family History  Problem Relation Age of Onset  . Diabetes Father   . Heart disease Father   . Heart attack Father   . Diabetes Sister   . Diabetes Brother      Social History Social History   Tobacco Use  . Smoking status: Never Smoker  . Smokeless tobacco: Never Used  Substance Use Topics  . Alcohol use: No  . Drug use: No     Allergies   Sulfa antibiotics, Doryx [doxycycline], Exemestane, Femara [letrozole], Penicillins, and Suprax [cefixime]   Review of Systems Review of Systems  Constitutional: Negative for fever.  HENT: Positive for sinus pressure. Negative for sore throat.   Respiratory: Negative for shortness of breath.   Cardiovascular: Negative for chest pain.  Gastrointestinal: Negative for abdominal pain, nausea and vomiting.  Genitourinary: Negative for flank pain.  Musculoskeletal: Negative for back pain.  Skin: Negative for pallor and wound.  Neurological: Positive for dizziness and weakness. Negative for seizures, syncope, facial asymmetry, speech difficulty, light-headedness, numbness and headaches.     Physical Exam Updated Vital Signs BP (!) 91/52   Pulse 74   Temp 98 F (36.7 C) (Oral)   Resp (!) 22   Ht 5\' 2"  (1.575 m)   Wt 80.7 kg   SpO2 93%   BMI 32.56 kg/m   Physical Exam Vitals signs and nursing note reviewed.  Constitutional:      Appearance: Normal appearance.  HENT:     Head: Normocephalic and atraumatic.     Right Ear: Hearing normal. There is impacted cerumen.     Left Ear: Hearing normal. There is impacted cerumen.     Mouth/Throat:     Mouth: Mucous membranes are dry.  Eyes:     Extraocular Movements:     Right eye: No nystagmus.     Left eye: No nystagmus.     Pupils: Pupils are equal, round, and reactive to light.     Comments: No nystagmus on my exam.   Neck:     Musculoskeletal: Normal range of motion and neck supple.  Cardiovascular:     Rate and Rhythm: Normal rate and regular rhythm.     Comments: No BL pitting edema.  Pulmonary:     Effort: Pulmonary effort is normal.     Breath sounds: Normal breath sounds. No wheezing or rales.     Comments: Lungs are clear to  auscultation.  Abdominal:     General: Abdomen is flat. Bowel sounds are decreased.     Tenderness: There is no abdominal tenderness. There is no right CVA tenderness or left CVA tenderness.     Comments: Bowel sounds slightly diminished, non tender to palpation. No distention.   Musculoskeletal:     Right lower leg: No edema.     Left lower leg: No edema.  Skin:  General: Skin is warm and dry.  Neurological:     Mental Status: She is alert and oriented to person, place, and time.     Comments: Alert, oriented, thought content appropriate. Speech fluent without evidence of aphasia. Able to follow 2 step commands without difficulty.   Cranial Nerves:  II:  Peripheral visual fields grossly normal, pupils, round, reactive to light III,IV, VI: ptosis not present, extra-ocular motions intact bilaterally  V,VII: smile symmetric, facial light touch sensation equal VIII: hearing grossly normal bilaterally  IX,X: midline uvula rise  XI: bilateral shoulder shrug equal and strong XII: midline tongue extension  Motor:  5/5 in upper and lower extremities bilaterally including strong and equal grip strength and dorsiflexion/plantar flexion Sensory: light touch normal in all extremities.  Cerebellar: normal finger-to-nose with bilateral upper extremities, pronator drift negative      ED Treatments / Results  Labs (all labs ordered are listed, but only abnormal results are displayed) Labs Reviewed  CBC WITH DIFFERENTIAL/PLATELET - Abnormal; Notable for the following components:      Result Value   Platelets 132 (*)    All other components within normal limits  BASIC METABOLIC PANEL - Abnormal; Notable for the following components:   Sodium 134 (*)    Chloride 96 (*)    Glucose, Bld 145 (*)    Creatinine, Ser 1.32 (*)    GFR calc non Af Amer 37 (*)    GFR calc Af Amer 43 (*)    All other components within normal limits  TSH - Abnormal; Notable for the following components:   TSH 0.275  (*)    All other components within normal limits  CBG MONITORING, ED - Abnormal; Notable for the following components:   Glucose-Capillary 125 (*)    All other components within normal limits  LACTIC ACID, PLASMA  URINALYSIS, ROUTINE W REFLEX MICROSCOPIC  LACTIC ACID, PLASMA    EKG EKG Interpretation  Date/Time:  Thursday August 10 2019 08:51:11 EST Ventricular Rate:  82 PR Interval:    QRS Duration: 92 QT Interval:  385 QTC Calculation: 450 R Axis:   36 Text Interpretation: Sinus or ectopic atrial rhythm Low voltage, extremity and precordial leads Anteroseptal infarct, old Nonspecific T abnormalities, lateral leads No significant change since last tracing Confirmed by Deno Etienne [phone]) on 08/10/2019 9:35:20 AM   Radiology Dg Chest Portable 1 View  Result Date: 08/10/2019 CLINICAL DATA:  Shortness of breath EXAM: PORTABLE CHEST 1 VIEW COMPARISON:  May 30, 2009 FINDINGS: There is no edema or consolidation. Heart is upper normal in size with pulmonary vascularity normal. No adenopathy. No bone lesions. IMPRESSION: No edema or consolidation. Heart upper normal in size. No evident adenopathy. Electronically Signed   By: Lowella Grip III M.D.   On: 08/10/2019 09:18    Procedures .Critical Care Performed by: Janeece Fitting, PA-C Authorized by: Janeece Fitting, PA-C   Critical care provider statement:    Critical care time (minutes):  35   Critical care start time:  08/10/2019 8:59 AM   Critical care end time:  08/10/2019 9:36 AM   Critical care time was exclusive of:  Separately billable procedures and treating other patients   Critical care was necessary to treat or prevent imminent or life-threatening deterioration of the following conditions:  Dehydration   Critical care was time spent personally by me on the following activities:  Blood draw for specimens, development of treatment plan with patient or surrogate, discussions with consultants, evaluation of patient's  response  to treatment, examination of patient, obtaining history from patient or surrogate, ordering and performing treatments and interventions, ordering and review of laboratory studies, ordering and review of radiographic studies, pulse oximetry, re-evaluation of patient's condition and review of old charts   (including critical care time)  Medications Ordered in ED Medications  docusate (COLACE) 50 MG/5ML liquid 50 mg (has no administration in time range)  sodium chloride 0.9 % bolus 500 mL (0 mLs Intravenous Stopped 08/10/19 1031)  sodium chloride 0.9 % bolus 1,000 mL (1,000 mLs Intravenous New Bag/Given 08/10/19 1017)     Initial Impression / Assessment and Plan / ED Course  I have reviewed the triage vital signs and the nursing notes.  Pertinent labs & imaging results that were available during my care of the patient were reviewed by me and considered in my medical decision making (see chart for details).    Patient with a past medical history of hypertension, thyroid disease presents to the ED with complaints of dizziness, this was a sudden onset this morning while cooking breakfast for the homeless at church.  She reports she immediately sat down, had her blood pressure recorded at discharge which was remarkable for hypotension with systolic in the [id number], diastolics around the [id number].  She is currently on blood pressure medication Hyzaar once daily.  Does report compliance with her blood pressure medication.  She also reports she had not eaten prior to attending this event.  During evaluation patient reports dizziness at baseline, exacerbated with movement, bilateral ears are remarkable for cerumen impaction.  Her vitals on arrival were remarkable for hypotension with a blood pressure of 96/49, no hypoxia or tachycardia.  She is afebrile.   CBC is without any leukocytosis, hemoglobin is stable, no signs of any anemia. CMP without any hypokalemia, sodium is slightly decrease although  stable.  She does have a mild AKI at 1.32, this has doubled since her last records 4 years ago.CBG normal. Lactic acid is negative.  Xray is: No edema or consolidation. Heart upper normal in size. No evident  adenopathy.     Patient has received 1 1/2 L of NS without much improvement in her blood pressure, she was placed in trendelenburg by me, she remains hypotensive.    10:58 AM BP (!) 91/52   Pulse 74   Temp 98 F (36.7 C) (Oral)   Resp (!) 22   Ht 5\' 2"  (1.575 m)   Wt 80.7 kg   SpO2 93%   BMI 32.56 kg/m   Patient was informed of her results, due to patient being consistently hypotensive along with mild AKI feel that is resolved for patient to be further treated in the inpatient hospitalist service.  11:27 AM Spoke to Dr. Tamala Julian who will admit patient from further management of her hypotension due to hypovolemia.     Portions of this note were generated with [employer]. Dictation errors may occur despite best attempts at proofreading.  Final Clinical Impressions(s) / ED Diagnoses   Final diagnoses:  Dizziness  Hypotension due to hypovolemia  Bilateral impacted cerumen    ED Discharge Orders    None       Janeece Fitting, PA-C 08/10/19 [place], DO 08/10/19 1541

## 2019-08-10 NOTE — H&P (Addendum)
History and Physical    [name] [id number] DOB: 08-18-1935 DOA: 08/10/2019  Referring MD/NP/PA:Johanna Irene Pap, PA-C PCP: Barbara Bowen, MD  Patient coming from: [facility] via EMS  Chief Complaint: Dizziness  I have personally briefly reviewed patient's old medical records in [place]   HPI: [name] is a 84 y.o. female with medical history significant of hypertension, hyperlipidemia, hypothyroidism, and history of breast cancer.  She presents with complaints of dizziness while she was at church.  She states similar things have happened once or twice over the last 2 months.  Usually occurs with changes in positions or early in the morning when she wakes up.  [name] feels that she has been eating and drinking like normal.  She has had a cough with clear sputum production and felt like her ears have been clogged over the last 1 to 2 weeks.  Blames the cough and sinus drainage on on allergies.  He has been taking allergy pills without relief of symptoms.  Other associated symptoms include chills and diarrhea which she reports is more so chronic.  She takes Metamucil which helps make stools more formed.  Denies having any significant fever, recent sick contacts to her knowledge, or change in medications.  ED Course: Upon admission into the emergency department patient was noted to have initial blood pressure is 89/49 with improvement after 1.5 L of normal saline IV fluid.  Labs significant for WBC 6.6, platelet count 132, sodium 134, BUN 19, creatinine 1.32, and TSH 0.275.  Patient with COVID-19 screening eventually resulted positive once patient got to the floor.  Review of Systems  Constitutional: Positive for chills and malaise/fatigue. Negative for fever.  HENT: Positive for congestion and ear discharge.   Eyes: Negative for photophobia and pain.  Respiratory: Positive for cough and sputum production. Negative for shortness of breath.   Cardiovascular: Negative  for chest pain and leg swelling.  Gastrointestinal: Positive for diarrhea. Negative for abdominal pain, nausea and vomiting.  Genitourinary: Negative for dysuria.  Musculoskeletal: Negative for falls and myalgias.  Neurological: Positive for dizziness and weakness. Negative for focal weakness and loss of consciousness.  Psychiatric/Behavioral: Negative for memory loss and substance abuse.    Past Medical History:  Diagnosis Date  . Arthritis   . Arthritis pain   . Breast cancer ([place]) 2010   left breast  . Cancer ([facility]) 04/2009   Left breast cancer  . Cough   . 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 GERD (gastroesophageal reflux disease)   . Hyperlipidemia   . Hypertension   . Nasal congestion   . Personal history of radiation therapy   . Sinusitis   . Sleep apnea   . Thyroid disease     Past Surgical History:  Procedure Laterality Date  . BREAST BIOPSY    . BREAST LUMPECTOMY Left 2010  . BREAST LUMPECTOMY W/ NEEDLE LOCALIZATION Left 06/04/2009  . BREAST SURGERY    . CARPAL TUNNEL RELEASE    . CHOLECYSTECTOMY    . DILATION AND CURETTAGE OF UTERUS    . ROTATOR CUFF REPAIR    . stomach polyps    . TONSILLECTOMY       reports that she has never smoked. She has never used smokeless tobacco. She reports that she does not drink alcohol or use drugs.  Allergies  Allergen Reactions  . Sulfa Antibiotics Hives, Itching and Rash  . Doryx [Doxycycline] Swelling  . Exemestane Itching  . Femara [Letrozole] Other (See Comments)  Aching joints   . Penicillins Itching  . Suprax [Cefixime] Itching    Family History  Problem Relation Age of Onset  . Diabetes Father   . Heart disease Father   . Heart attack Father   . Diabetes Sister   . Diabetes Brother     Prior to Admission medications   Medication Sig Start Date End Date Taking? Authorizing Provider  Ascorbic Acid (VITAMIN C) 1000 MG tablet Take 1,000 mg by mouth daily.      [provider]  Calcium  Carb-Cholecalciferol [phone] MG-UNIT TABS Take by mouth.    [provider]  Calcium Carbonate-Vit D-Min (CALCIUM 1200 PO) Take 1 tablet by mouth daily.    [provider]  cetirizine (ZYRTEC) 10 MG tablet Take by mouth.    [provider]  Cholecalciferol (VITAMIN D PO) Take 5,000 mg by mouth daily.    [provider]  clindamycin (CLINDAGEL) 1 % gel Apply topically 2 (two) times daily.    [provider]  FINACEA 15 % cream For  Rosacea on face. 08/06/14   [provider]  levothyroxine (SYNTHROID, LEVOTHROID) 100 MCG tablet Take 100 mcg by mouth daily.      [provider]  losartan-hydrochlorothiazide (HYZAAR) 100-12.5 MG per tablet Take 1 tablet by mouth daily.  11/13/12   [provider]  Magnesium 250 MG TABS Take by mouth daily.    [provider]  methocarbamol (ROBAXIN) 500 MG tablet Take 500 mg as needed by mouth for muscle spasms.    [provider]  niacin 500 MG tablet Take 500 mg by mouth daily.    [provider]  Omega-3 Krill Oil 500 MG CAPS Take 1 capsule by mouth daily.    [provider]  omeprazole (PRILOSEC) 20 MG capsule Take 20 mg by mouth daily.     [provider]  VOLTAREN 1 % GEL Apply 2 g topically 4 (four) times daily.  12/30/12   [provider]    Physical Exam:  Constitutional: NAD, calm, comfortable Vitals:   08/10/19 0945 08/10/19 1000 08/10/19 1015 08/10/19 1030  BP: (!) 97/57 (!) 97/57 (!) 99/56 (!) 91/52  Pulse: 77 74 76 74  Resp: (!) 24 (!) 23 (!) 23 (!) 22  Temp:      TempSrc:      SpO2: 99% 98% 99% 93%  Weight:      Height:       Eyes: PERRL, lids and conjunctivae normal ENMT: Mucous membranes are moist. Posterior pharynx clear of any exudate or lesions.Normal dentition.  Neck: normal, supple, no masses, no thyromegaly Respiratory: clear to auscultation bilaterally, no wheezing, no crackles. Normal respiratory effort.  No accessory muscle use.  Cardiovascular: Regular rate and rhythm, no murmurs / rubs / gallops. No extremity edema. 2+ pedal pulses. No carotid bruits.  Abdomen: no tenderness, no masses palpated. No hepatosplenomegaly. Bowel sounds positive.  Musculoskeletal: no clubbing / cyanosis. No joint deformity upper and lower extremities. Good ROM, no contractures. Normal muscle tone.  Skin: no rashes, lesions, ulcers. No induration Neurologic: CN 2-12 grossly intact. Sensation intact, DTR normal. Strength 5/5 in all 4.  Psychiatric: Normal judgment and insight. Alert and oriented x 3. Normal mood.     Labs on Admission: I have personally reviewed following labs and imaging studies  CBC: Recent Labs  Lab 08/10/19 0911  WBC 6.6  NEUTROABS 4.9  HGB 12.3  HCT 40.3  MCV 92.0  PLT 132*  Basic Metabolic Panel: Recent Labs  Lab 08/10/19 0911  NA 134*  K 4.4  CL 96*  CO2 26  GLUCOSE 145*  BUN 19  CREATININE 1.32*  CALCIUM 9.1   GFR: Estimated Creatinine Clearance: 32.3 mL/min (A) (by C-G formula based on SCr of 1.32 mg/dL (H)). Liver Function Tests: No results for input(s): AST, ALT, ALKPHOS, BILITOT, PROT, ALBUMIN in the last 168 hours. No results for input(s): LIPASE, AMYLASE in the last 168 hours. No results for input(s): AMMONIA in the last 168 hours. Coagulation Profile: No results for input(s): INR, PROTIME in the last 168 hours. Cardiac Enzymes: No results for input(s): CKTOTAL, CKMB, CKMBINDEX, TROPONINI in the last 168 hours. BNP (last 3 results) No results for input(s): PROBNP in the last 8760 hours. HbA1C: No results for input(s): HGBA1C in the last 72 hours. CBG: Recent Labs  Lab 08/10/19 0946  GLUCAP 125*   Lipid Profile: No results for input(s): CHOL, HDL, LDLCALC, TRIG, CHOLHDL, LDLDIRECT in the last 72 hours. Thyroid Function Tests: Recent Labs    08/10/19 1010  TSH 0.275*   Anemia Panel: No results for input(s): VITAMINB12, FOLATE, FERRITIN, TIBC,  IRON, RETICCTPCT in the last 72 hours. Urine analysis: No results found for: COLORURINE, APPEARANCEUR, LABSPEC, PHURINE, GLUCOSEU, HGBUR, BILIRUBINUR, KETONESUR, PROTEINUR, UROBILINOGEN, NITRITE, LEUKOCYTESUR Sepsis Labs: No results found for this or any previous visit (from the past 240 hour(s)).   Radiological Exams on Admission: Dg Chest Portable 1 View  Result Date: 08/10/2019 CLINICAL DATA:  Shortness of breath EXAM: PORTABLE CHEST 1 VIEW COMPARISON:  May 30, 2009 FINDINGS: There is no edema or consolidation. Heart is upper normal in size with pulmonary vascularity normal. No adenopathy. No bone lesions. IMPRESSION: No edema or consolidation. Heart upper normal in size. No evident adenopathy. Electronically Signed   By: Lowella Grip III M.D.   On: 08/10/2019 09:18    EKG: Independently reviewed.  Sinus rhythm 82 bpm.  Assessment/Plan Transient hypotension and dizziness: Acute.  Patient presents with complaints of dizziness and was found to have blood pressures in 80s/50s.  She reported eating and drinking like normal and denied having any fevers.  Patient normally on losartan-hydrochlorothiazide for treatment of blood pressure.  -Admit to a medical telemetry bed -Check orthostatic vital signs daily x2 -Hold antihypertensive meds -Continue normal saline IV fluids at 75 mL/h  Addendum COVID-19 virus infection: Acute.  Patient reported having intermittent cough.  Chest x-ray otherwise clear and patient maintaining O2 saturations on room air.  Suspect this could be likely reason for dehydration with acute kidney injury. -COVID-19 focus order set utilized -Inflammatory markers checked in a.m. -Albuterol inhaler as needed -Antitussives as needed -Vitamin C and zinc -Avoid NSAIDs  Acute kidney injury secondary to dehydration: Patient's baseline creatinine previously noted to be around 0.8, but she presents with creatinine elevated up to 1.32 with BUN 19.  Suspect patient acutely  dehydrated given findings of hyponatremia, diuretic use, and hypotension. -Hold nephrotoxic agents such as Hyzaar -Recheck kidney function in a.m.  Cerumen impaction: Acute: Noted to have bilateral cerumen impaction. -Debrox eardrops  Hypothyroidism: TSH noted to be 0.275 on admission. -Continue levothyroxine  -Defer to patient's PCP for dose of  GERD -Pharmacy substitution of Protonix for omeprazole  DVT prophylaxis: Lovenox Code Status: Full Family Communication: Discussed plan of care with the patient son who is present at bedside Disposition Plan: Likely discharge home once medically stable Consults called: none Admission status: observation  Norval Morton MD [facility] Pager [phone]  If 7PM-7AM, please contact night-coverage [email or web address] Password [email or web address]  08/10/2019, 11:22 AM

## 2019-08-10 NOTE — ED Triage Notes (Signed)
Pt reports blood pressure systolic in [id number] and [id number] this morning after taking blood pressure medication.

## 2019-08-10 NOTE — Evaluation (Signed)
Physical Therapy Evaluation and Discharge Patient Details Name: [name] MRN: [id number] DOB: 1935/08/14 Today's Date: 08/10/2019   History of Present Illness  Pt is an 84 y/o female who presents with generalized weakness and low blood pressure. PMH significant for thyroid disease, sinusitis, breast CA s/p radiation, HTN, back pain (arthritis).  Clinical Impression  Patient evaluated by Physical Therapy with no further acute PT needs identified. All education has been completed and the patient has no further questions. At the time of PT eval pt was able to perform transfers and ambulation with gross modified independence and no AD. Pt reports she feels at baseline of function, just tired and "weak". Overall tolerance for functional activity appears near baseline as well based on what pt describes. She is active at baseline, working 2 days a week at [employer], driving, and doing own errands/shopping. Son reports he can be available 24 hours to assist pt if needed at d/c as pt lives alone. See below for any follow-up Physical Therapy or equipment needs. PT is signing off. Thank you for this referral.     Follow Up Recommendations No PT follow up;Supervision - Intermittent    Equipment Recommendations  None recommended by PT    Recommendations for Other Services       Precautions / Restrictions Precautions Precautions: None Restrictions Weight Bearing Restrictions: No      Mobility  Bed Mobility Overal bed mobility: Modified Independent Bed Mobility: Supine to Sit;Sit to Supine           General bed mobility comments: Pt was able to transition to/from EOB without assistance. Increased time to get settled back in bed and scoot up fully to [facility].   Transfers Overall transfer level: Modified independent Equipment used: None Transfers: Sit to/from Stand           General transfer comment: No assist required. No unsteadiness noted. Increased time due to back pain.    Ambulation/Gait Ambulation/Gait assistance: Modified independent (Device/Increase time) Gait Distance (Feet): 300 Feet Assistive device: None Gait Pattern/deviations: Step-through pattern;Decreased stride length;Wide base of support Gait velocity: Decreased Gait velocity interpretation: <1.8 ft/sec, indicate of risk for recurrent falls General Gait Details: Pt holding her low back at times while ambulating due to pain. Overall steady but noted lateral sway bilaterally - not quite a trendelenberg gait pattern. Pt with no overt LOB and reports she is ambulating at baseline of function.   Stairs            Wheelchair Mobility    Modified Rankin (Stroke Patients Only)       Balance Overall balance assessment: Needs assistance Sitting-balance support: Feet supported;No upper extremity supported Sitting balance-Leahy Scale: Fair     Standing balance support: No upper extremity supported;During functional activity Standing balance-Leahy Scale: Fair Standing balance comment: Stood at sink and washed hands without assistance.                              Pertinent Vitals/Pain Pain Assessment: Faces Faces Pain Scale: Hurts little more Pain Location: Back pain Pain Descriptors / Indicators: Aching Pain Intervention(s): Limited activity within patient's tolerance;Monitored during session;Repositioned    Home Living Family/patient expects to be discharged to:: Private residence Living Arrangements: Alone Available Help at Discharge: Family;Available 24 hours/day Type of Home: House Home Access: Stairs to enter Entrance Stairs-Rails: Right;Left;Can reach both Entrance Stairs-Number of Steps: 3 Home Layout: One level Home Equipment: Walker - 2 wheels;Cane -  single point;Shower seat;Bedside commode      Prior Function Level of Independence: Independent         Comments: Drives, works part time at [employer] 2 days a week, indpendent with IADL's     Hand  Dominance        Extremity/Trunk Assessment   Upper Extremity Assessment Upper Extremity Assessment: Defer to OT evaluation    Lower Extremity Assessment Lower Extremity Assessment: Generalized weakness    Cervical / Trunk Assessment Cervical / Trunk Assessment: Other exceptions Cervical / Trunk Exceptions: Forward head posture with rounded shoulders  Communication   Communication: HOH(Mild)  Cognition Arousal/Alertness: Awake/alert Behavior During Therapy: WFL for tasks assessed/performed Overall Cognitive Status: Impaired/Different from baseline Area of Impairment: Safety/judgement                         Safety/Judgement: Decreased awareness of safety     General Comments: Poor safety awareness in regards to IV pole      General Comments      Exercises     Assessment/Plan    PT Assessment Patent does not need any further PT services  PT Problem List         PT Treatment Interventions      PT Goals (Current goals can be found in the Care Plan section)  Acute Rehab PT Goals PT Goal Formulation: All assessment and education complete, DC therapy    Frequency     Barriers to discharge        Co-evaluation               AM-PAC PT "6 Clicks" Mobility  Outcome Measure Help needed turning from your back to your side while in a flat bed without using bedrails?: None Help needed moving from lying on your back to sitting on the side of a flat bed without using bedrails?: None Help needed moving to and from a bed to a chair (including a wheelchair)?: None Help needed standing up from a chair using your arms (e.g., wheelchair or bedside chair)?: None Help needed to walk in hospital room?: None Help needed climbing 3-5 steps with a railing? : None 6 Click Score: 24    End of Session   Activity Tolerance: Patient tolerated treatment well Patient left: in bed;with call bell/phone within reach;with family/visitor present Nurse Communication:  Mobility status PT Visit Diagnosis: Muscle weakness (generalized) (M62.81)    Time: [id number] PT Time Calculation (min) (ACUTE ONLY): 25 min   Charges:   PT Evaluation $PT Eval Moderate Complexity: 1 Mod PT Treatments $Gait Training: 8-22 mins        [name], PT, DPT Acute Rehabilitation Services Pager: [phone] Office: [phone]   [name] 08/10/2019, 2:59 PM

## 2019-08-10 NOTE — ED Notes (Addendum)
ED TO INPATIENT HANDOFF REPORT  ED Nurse Name and Phone #: Thurmond Butts [place] Name/Age/Gender [name] 84 y.o. female Room/Bed: 039C/039C  Code Status   Code Status: Full Code  Home/SNF/Other Home Patient oriented to: self, place, time and situation Is this baseline? Yes   Triage Complete: Triage complete  Chief Complaint lbp/gen weakness  Triage Note Pt reports blood pressure systolic in [id number] and [id number] this morning after taking blood pressure medication.    Allergies Allergies  Allergen Reactions  . Sulfa Antibiotics Hives, Itching and Rash  . Doryx [Doxycycline] Swelling  . Exemestane Itching  . Femara [Letrozole] Other (See Comments)    Aching joints   . Penicillins Itching    Did it involve swelling of the face/tongue/throat, SOB, or low BP? No Did it involve sudden or severe rash/hives, skin peeling, or any reaction on the inside of your mouth or nose? Yes Did you need to seek medical attention at a hospital or doctor's office? Yes When did it last happen?unable to recall If all above answers are "NO", may proceed with cephalosporin use.   Wynona Neat [Cefixime] Itching    Level of Care/Admitting Diagnosis ED Disposition    ED Disposition Condition [facility] Area: [place] [100100]  Level of Care: Telemetry Medical [104]  I expect the patient will be discharged within 24 hours: Yes  LOW acuity---Tx typically complete <24 hrs---ACUTE conditions typically can be evaluated <24 hours---LABS likely to return to acceptable levels <24 hours---IS near functional baseline---EXPECTED to return to current living arrangement---NOT newly hypoxic: Meets criteria for 5C-Observation unit  Covid Evaluation: Asymptomatic Screening Protocol (No Symptoms)  Diagnosis: Hypotension [id number]  Admitting Physician: Norval Morton [id number]  Attending Physician: Norval Morton [id number]  PT Class (Do Not Modify): Observation [104]  PT  Acc Code (Do Not Modify): Observation [10022]       B Medical/Surgery History Past Medical History:  Diagnosis Date  . Arthritis   . Arthritis pain   . Breast cancer ([place]) 2010   left breast  . Cancer ([place]) 04/2009   Left breast cancer  . Cough   . 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 GERD (gastroesophageal reflux disease)   . Hyperlipidemia   . Hypertension   . Nasal congestion   . Personal history of radiation therapy   . Sinusitis   . Sleep apnea   . Thyroid disease    Past Surgical History:  Procedure Laterality Date  . BREAST BIOPSY    . BREAST LUMPECTOMY Left 2010  . BREAST LUMPECTOMY W/ NEEDLE LOCALIZATION Left 06/04/2009  . BREAST SURGERY    . CARPAL TUNNEL RELEASE    . CHOLECYSTECTOMY    . DILATION AND CURETTAGE OF UTERUS    . ROTATOR CUFF REPAIR    . stomach polyps    . TONSILLECTOMY       A IV Location/Drains/Wounds Patient Lines/Drains/Airways Status   Active Line/Drains/Airways    Name:   Placement date:   Placement time:   Site:   Days:   Peripheral IV 08/10/19 Left Antecubital   08/10/19    0914    Antecubital   less than 1          Intake/Output Last 24 hours  Intake/Output Summary (Last 24 hours) at 08/10/2019 1250 Last data filed at 08/10/2019 1218 Gross per 24 hour  Intake 1500 ml  Output -  Net 1500 ml    Labs/Imaging Results for orders placed  or performed during the hospital encounter of 08/10/19 (from the past 48 hour(s))  CBC with Differential     Status: Abnormal   Collection Time: 08/10/19  9:11 AM  Result Value Ref Range   WBC 6.6 4.0 - 10.5 K/uL   RBC 4.38 3.87 - 5.11 MIL/uL   Hemoglobin 12.3 12.0 - 15.0 g/dL   HCT 40.3 36.0 - 46.0 %   MCV 92.0 80.0 - 100.0 fL   MCH 28.1 26.0 - 34.0 pg   MCHC 30.5 30.0 - 36.0 g/dL   RDW 14.9 11.5 - 15.5 %   Platelets 132 (L) 150 - 400 K/uL    Comment: REPEATED TO VERIFY   nRBC 0.0 0.0 - 0.2 %   Neutrophils Relative % 73 %   Neutro Abs 4.9 1.7 - 7.7 K/uL   Lymphocytes  Relative 13 %   Lymphs Abs 0.9 0.7 - 4.0 K/uL   Monocytes Relative 12 %   Monocytes Absolute 0.8 0.1 - 1.0 K/uL   Eosinophils Relative 0 %   Eosinophils Absolute 0.0 0.0 - 0.5 K/uL   Basophils Relative 1 %   Basophils Absolute 0.0 0.0 - 0.1 K/uL   Immature Granulocytes 1 %   Abs Immature Granulocytes 0.04 0.00 - 0.07 K/uL    Comment: Performed at [facility], [address] [place], [place] [id number]  Basic metabolic panel     Status: Abnormal   Collection Time: 08/10/19  9:11 AM  Result Value Ref Range   Sodium 134 (L) 135 - 145 mmol/L   Potassium 4.4 3.5 - 5.1 mmol/L   Chloride 96 (L) 98 - 111 mmol/L   CO2 26 22 - 32 mmol/L   Glucose, Bld 145 (H) 70 - 99 mg/dL   BUN 19 8 - 23 mg/dL   Creatinine, Ser 1.32 (H) 0.44 - 1.00 mg/dL   Calcium 9.1 8.9 - 10.3 mg/dL   GFR calc non Af Amer 37 (L) >60 mL/min   GFR calc Af Amer 43 (L) >60 mL/min   Anion gap 12 5 - 15    Comment: Performed at [facility] [address], [place], [place] [address]  POC CBG, ED     Status: Abnormal   Collection Time: 08/10/19  9:46 AM  Result Value Ref Range   Glucose-Capillary 125 (H) 70 - 99 mg/dL  Lactic acid, plasma     Status: None   Collection Time: 08/10/19 10:00 AM  Result Value Ref Range   Lactic Acid, Venous 1.7 0.5 - 1.9 mmol/L    Comment: Performed at [facility] [address] [place], [place] [address]  TSH     Status: Abnormal   Collection Time: 08/10/19 10:10 AM  Result Value Ref Range   TSH 0.275 (L) 0.350 - 4.500 uIU/mL    Comment: Performed by a 3rd Generation assay with a functional sensitivity of <=0.01 uIU/mL. Performed at [facility], [place] [address], [place],  [address]    Dg Chest Portable 1 View  Result Date: 08/10/2019 CLINICAL DATA:  Shortness of breath EXAM: PORTABLE CHEST 1 VIEW COMPARISON:  May 30, 2009 FINDINGS: There is no edema or consolidation. Heart is upper normal in size with pulmonary vascularity normal. No adenopathy.  No bone lesions. IMPRESSION: No edema or consolidation. Heart upper normal in size. No evident adenopathy. Electronically Signed   By: Lowella Grip III M.D.   On: 08/10/2019 09:18    Pending Labs [employer] (From  admission, onward)    Start     Ordered   08/11/19 0500  CBC  Tomorrow morning,   R     08/10/19 1155   08/11/19 [id number]  Basic metabolic panel  Tomorrow morning,   R     08/10/19 1155   08/10/19 1214  SARS CORONAVIRUS 2 (TAT 6-24 HRS) Nasopharyngeal Nasopharyngeal Swab  (Asymptomatic/Tier 3)  Once,   STAT    Question Answer Comment  Is this test for diagnosis or screening Screening   Symptomatic for COVID-19 as defined by CDC No   Hospitalized for COVID-19 No   Admitted to ICU for COVID-19 No   Previously tested for COVID-19 No   Resident in a congregate (group) care setting No   Employed in healthcare setting No   Pregnant No      08/10/19 1213   08/10/19 1153  Sodium, urine, random  ONCE - STAT,   STAT     08/10/19 1155   08/10/19 1153  Urea nitrogen, urine  ONCE - STAT,   STAT     08/10/19 1155   08/10/19 1153  Creatinine, urine, random  ONCE - STAT,   STAT     08/10/19 1155   08/10/19 0939  Lactic acid, plasma  Now then every 2 hours,   STAT     08/10/19 0938   08/10/19 0855  Urinalysis, Routine w reflex microscopic  Once,   STAT     08/10/19 0855          Vitals/Pain Today's Vitals   08/10/19 1000 08/10/19 1015 08/10/19 1019 08/10/19 1030  BP: (!) 97/57 (!) 99/56  (!) 91/52  Pulse: 74 76  74  Resp: (!) 23 (!) 23  (!) 22  Temp:      TempSrc:      SpO2: 98% 99%  93%  Weight:      Height:      PainSc:   0-No pain     Isolation Precautions No active isolations  Medications Medications  docusate (COLACE) 50 MG/5ML liquid 50 mg (has no administration in time range)  enoxaparin (LOVENOX) injection 40 mg (has no administration in time range)  sodium chloride flush (NS) 0.9 % injection 3 mL (has no administration in time range)  0.9 %  sodium  chloride infusion (has no administration in time range)  ondansetron (ZOFRAN) tablet 4 mg (has no administration in time range)    Or  ondansetron (ZOFRAN) injection 4 mg (has no administration in time range)  acetaminophen (TYLENOL) tablet 650 mg (has no administration in time range)    Or  acetaminophen (TYLENOL) suppository 650 mg (has no administration in time range)  albuterol (PROVENTIL) (2.5 MG/3ML) 0.083% nebulizer solution 2.5 mg (has no administration in time range)  sodium chloride 0.9 % bolus 500 mL (0 mLs Intravenous Stopped 08/10/19 1031)  sodium chloride 0.9 % bolus 1,000 mL (0 mLs Intravenous Stopped 08/10/19 1218)    Mobility walks Low fall risk   Focused Assessments    R Recommendations: See Admitting Provider Note  Report given to: Elliot Dally RN  Additional Notes:

## 2019-08-10 NOTE — Progress Notes (Signed)
Pt requested medication to help her sleep tonight and has no PRN medications for sleep. Paged on call NP [place] and was told by [facility], "There are no admitting physician notes in patient's chart and because pt was admitted for hypotension, she would not give patient anything to help her sleep." Will notify pt and continue to monitor.

## 2019-08-10 NOTE — Progress Notes (Signed)
Report given to 2W nurse by off [place]. Transported in bed to room 222 due to positive Covid.  RN from 2W in room with patient. Patient alert and oriented respirations even and unlabored on room air. Pt has 2 rings, watch, cell phone, cell phone charger and block, clothes, shoes, book and pocketbook.

## 2019-08-11 ENCOUNTER — Other Ambulatory Visit: Payer: Self-pay

## 2019-08-11 DIAGNOSIS — E669 Obesity, unspecified: Secondary | ICD-10-CM | POA: Diagnosis present

## 2019-08-11 DIAGNOSIS — Z853 Personal history of malignant neoplasm of breast: Secondary | ICD-10-CM | POA: Diagnosis not present

## 2019-08-11 DIAGNOSIS — I10 Essential (primary) hypertension: Secondary | ICD-10-CM | POA: Diagnosis present

## 2019-08-11 DIAGNOSIS — R42 Dizziness and giddiness: Secondary | ICD-10-CM | POA: Diagnosis present

## 2019-08-11 DIAGNOSIS — E860 Dehydration: Secondary | ICD-10-CM | POA: Diagnosis present

## 2019-08-11 DIAGNOSIS — Z833 Family history of diabetes mellitus: Secondary | ICD-10-CM | POA: Diagnosis not present

## 2019-08-11 DIAGNOSIS — E861 Hypovolemia: Secondary | ICD-10-CM | POA: Diagnosis present

## 2019-08-11 DIAGNOSIS — Z923 Personal history of irradiation: Secondary | ICD-10-CM | POA: Diagnosis not present

## 2019-08-11 DIAGNOSIS — E871 Hypo-osmolality and hyponatremia: Secondary | ICD-10-CM | POA: Diagnosis present

## 2019-08-11 DIAGNOSIS — M1990 Unspecified osteoarthritis, unspecified site: Secondary | ICD-10-CM | POA: Diagnosis present

## 2019-08-11 DIAGNOSIS — Z8249 Family history of ischemic heart disease and other diseases of the circulatory system: Secondary | ICD-10-CM | POA: Diagnosis not present

## 2019-08-11 DIAGNOSIS — N179 Acute kidney failure, unspecified: Secondary | ICD-10-CM | POA: Diagnosis present

## 2019-08-11 DIAGNOSIS — Z79890 Hormone replacement therapy (postmenopausal): Secondary | ICD-10-CM | POA: Diagnosis not present

## 2019-08-11 DIAGNOSIS — Z20828 Contact with and (suspected) exposure to other viral communicable diseases: Secondary | ICD-10-CM | POA: Diagnosis present

## 2019-08-11 DIAGNOSIS — K219 Gastro-esophageal reflux disease without esophagitis: Secondary | ICD-10-CM | POA: Diagnosis present

## 2019-08-11 DIAGNOSIS — Z881 Allergy status to other antibiotic agents status: Secondary | ICD-10-CM | POA: Diagnosis not present

## 2019-08-11 DIAGNOSIS — E785 Hyperlipidemia, unspecified: Secondary | ICD-10-CM | POA: Diagnosis present

## 2019-08-11 DIAGNOSIS — Z882 Allergy status to sulfonamides status: Secondary | ICD-10-CM | POA: Diagnosis not present

## 2019-08-11 DIAGNOSIS — H6123 Impacted cerumen, bilateral: Secondary | ICD-10-CM | POA: Diagnosis present

## 2019-08-11 DIAGNOSIS — G4730 Sleep apnea, unspecified: Secondary | ICD-10-CM | POA: Diagnosis present

## 2019-08-11 DIAGNOSIS — I959 Hypotension, unspecified: Secondary | ICD-10-CM | POA: Diagnosis not present

## 2019-08-11 DIAGNOSIS — U071 COVID-19: Secondary | ICD-10-CM | POA: Diagnosis present

## 2019-08-11 DIAGNOSIS — E039 Hypothyroidism, unspecified: Secondary | ICD-10-CM | POA: Diagnosis present

## 2019-08-11 DIAGNOSIS — I9589 Other hypotension: Secondary | ICD-10-CM | POA: Diagnosis present

## 2019-08-11 DIAGNOSIS — Z6834 Body mass index (BMI) 34.0-34.9, adult: Secondary | ICD-10-CM | POA: Diagnosis not present

## 2019-08-11 DIAGNOSIS — Z888 Allergy status to other drugs, medicaments and biological substances status: Secondary | ICD-10-CM | POA: Diagnosis not present

## 2019-08-11 LAB — COMPREHENSIVE METABOLIC PANEL
ALT: 25 U/L (ref 0–44)
AST: 31 U/L (ref 15–41)
Albumin: 3.3 g/dL — ABNORMAL LOW (ref 3.5–5.0)
Alkaline Phosphatase: 60 U/L (ref 38–126)
Anion gap: 11 (ref 5–15)
BUN: 14 mg/dL (ref 8–23)
CO2: 23 mmol/L (ref 22–32)
Calcium: 8.6 mg/dL — ABNORMAL LOW (ref 8.9–10.3)
Chloride: 100 mmol/L (ref 98–111)
Creatinine, Ser: 0.96 mg/dL (ref 0.44–1.00)
GFR calc Af Amer: >60 mL/min (ref >60)
GFR calc non Af Amer: 55 mL/min — ABNORMAL LOW (ref >60)
Glucose, Bld: 132 mg/dL — ABNORMAL HIGH (ref 70–99)
Potassium: 4.1 mmol/L (ref 3.5–5.1)
Sodium: 134 mmol/L — ABNORMAL LOW (ref 135–145)
Total Bilirubin: 0.8 mg/dL (ref 0.3–1.2)
Total Protein: 6.7 g/dL (ref 6.5–8.1)

## 2019-08-11 LAB — CBC
HCT: 36.1 % (ref 36.0–46.0)
Hemoglobin: 11.6 g/dL — ABNORMAL LOW (ref 12.0–15.0)
MCH: 28.2 pg (ref 26.0–34.0)
MCHC: 32.1 g/dL (ref 30.0–36.0)
MCV: 87.8 fL (ref 80.0–100.0)
Platelets: 120 10*3/uL — ABNORMAL LOW (ref 150–400)
RBC: 4.11 MIL/uL (ref 3.87–5.11)
RDW: 14.7 % (ref 11.5–15.5)
WBC: 5 10*3/uL (ref 4.0–10.5)
nRBC: 0 % (ref 0.0–0.2)

## 2019-08-11 LAB — FIBRINOGEN: Fibrinogen: 508 mg/dL — ABNORMAL HIGH (ref 210–475)

## 2019-08-11 LAB — C-REACTIVE PROTEIN: CRP: 2.4 mg/dL — ABNORMAL HIGH (ref <1.0)

## 2019-08-11 LAB — TROPONIN I (HIGH SENSITIVITY)
Troponin I (High Sensitivity): 9 ng/L (ref <18)
Troponin I (High Sensitivity): 9 ng/L (ref <18)

## 2019-08-11 LAB — MAGNESIUM: Magnesium: 1.8 mg/dL (ref 1.7–2.4)

## 2019-08-11 LAB — LACTATE DEHYDROGENASE: LDH: 138 U/L (ref 98–192)

## 2019-08-11 LAB — FERRITIN: Ferritin: 166 ng/mL (ref 11–307)

## 2019-08-11 LAB — PROCALCITONIN: Procalcitonin: <0.1 ng/mL

## 2019-08-11 LAB — T4, FREE: Free T4: 0.98 ng/dL (ref 0.61–1.12)

## 2019-08-11 LAB — BRAIN NATRIURETIC PEPTIDE: B Natriuretic Peptide: 220.6 pg/mL — ABNORMAL HIGH (ref 0.0–100.0)

## 2019-08-11 LAB — D-DIMER, QUANTITATIVE: D-Dimer, Quant: 0.37 ug/mL-FEU (ref 0.00–0.50)

## 2019-08-11 LAB — PHOSPHORUS: Phosphorus: 3.3 mg/dL (ref 2.5–4.6)

## 2019-08-11 MED ORDER — SODIUM CHLORIDE 0.9 % IV SOLN
INTRAVENOUS | Status: AC
  Administered 2019-08-11: 17:00:00 via INTRAVENOUS

## 2019-08-11 NOTE — Progress Notes (Signed)
PROGRESS NOTE   [name]  [id number]    DOB: 06-Nov-1934    DOA: 08/10/2019  PCP: Reynold Bowen, MD   I have briefly reviewed patients previous medical records in [facility].  Chief Complaint  Patient presents with  . 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  She also reported some URTI-like symptoms, chills and chronic diarrhea for which she takes Metamucil.  Admitted for hypotension secondary to dehydration, acute kidney injury and Covid positive.   Assessment & Plan:   Principal Problem:   Hypotension Active Problems:   Dizziness   COVID-19 virus infection   AKI (acute kidney injury) (HCC)   Hypothyroidism   GERD (gastroesophageal reflux disease)   Cerumen impaction   Transient hypotension and orthostatic dizziness/dehydration  Suspected due to dehydration from ongoing losartan-HCTZ use, poor oral intake and Covid.  Blood pressure in the 80s/50s on admission.  Hydrated with IV fluids.  Hypotension has resolved but still has mild orthostatic changes i.e. SBP dropped from 134/72 sitting >114/62 standing.   Continue to hold antihypertensives.  Continue gentle IV fluids.  Reassess orthostatics in a.m.  COVID-19 infection  Reported intermittent cough.  Chest x-ray clear.  No hypoxia.  No indication for steroids.  Remdesivir not initiated.  Continue vitamin C and zinc.  Monitor closely.  Inflammatory markers unremarkable.  Acute kidney injury  Baseline creatinine 0.8.  Presented with creatinine of 1.32.  Suspected due to dehydration, diuretics and ARB use.  Held Hyzaar.  Hydrated with IV fluids.  Creatinine normalized.  Hypothyroid  TSH low at 0.275 but free T4 normal.  Repeat TSH in 4 to 6 weeks.   GERD  Continue PPI.  Cerumen impaction  No earache.  Continue Debrox eardrops.  Atypical chest pain  Patient reportedly woke up from a nap this afternoon and complained of substernal dull ache and when she turned around, reportedly radiated to the back.  EKG not in epic, faxed quality is poor reviewed when no obvious acute changes noted.  Check HS troponin x1.  Trial of Tylenol.  Monitor.  Body mass index is 34.88 kg/m./Obesity  Anemia and thrombocytopenia  Anemia may be dilutional.  Thrombocytopenia unclear etiology,?  Related to Covid.  Follow CBC in a.m.   DVT prophylaxis: Lovenox Code Status: Full Family Communication: None at bedside Disposition: DC home pending clinical improvement, hopefully tomorrow.  Due to new onset of atypical chest pain that requires further evaluation, monitoring, ongoing orthostatic hypotension in the context of poor appetite requiring IV fluids, will manage additional night in the hospital and hence will change to inpatient status.   Consultants:  None  Procedures:  None  Antimicrobials:  None   Subjective: When seen this morning, patient reported feeling tired.  Still slightly dizzy when ambulating but did not pass out.  Poor appetite.  No nausea or vomiting.  No earache or pain elsewhere reported at that time.  Subsequently in the afternoon, chest discomfort reported by RN as above.  Objective:  Vitals:   08/10/19 2007 08/11/19 0011 08/11/19 0748 08/11/19 1100  BP: 135/67 121/71 109/73   Pulse: (!) 106 98 90   Resp: 18 18 18    Temp: 99.8 F (37.7 C) 98.8 F (37.1 C) 99.6 F (37.6 C)   TempSrc: Oral  Oral Oral   SpO2: 98% 95% 94% 95%  Weight: 86.5 kg     Height:        Examination:  General exam: Pleasant elderly female, moderately built and obese lying comfortably propped up in bed without distress.  Oral mucosa with borderline hydration. Respiratory system: Clear to auscultation. Respiratory effort normal.  Cardiovascular system: S1 & S2 heard, RRR. No JVD, murmurs, rubs, gallops or clicks. No pedal edema. Gastrointestinal system: Abdomen is nondistended, soft and nontender. No organomegaly or masses felt. Normal bowel sounds heard. Central nervous system: Alert and oriented. No focal neurological deficits. Extremities: Symmetric 5 x 5 power. Skin: No rashes, lesions or ulcers Psychiatry: Judgement and insight appear normal. Mood & affect appropriate.     Data Reviewed: I have personally reviewed following labs and imaging studies   CBC: Recent Labs  Lab 08/10/19 0911 08/11/19 0425  WBC 6.6 5.0  NEUTROABS 4.9  --   HGB 12.3 11.6*  HCT 40.3 36.1  MCV 92.0 87.8  PLT 132* 120*    Basic Metabolic Panel: Recent Labs  Lab 08/10/19 0911 08/11/19 0425  NA 134* 134*  K 4.4 4.1  CL 96* 100  CO2 26 23  GLUCOSE 145* 132*  BUN 19 14  CREATININE 1.32* 0.96  CALCIUM 9.1 8.6*  MG  --  1.8  PHOS  --  3.3    Liver Function Tests: Recent Labs  Lab 08/11/19 0425  AST 31  ALT 25  ALKPHOS 60  BILITOT 0.8  PROT 6.7  ALBUMIN 3.3*    CBG: Recent Labs  Lab 08/10/19 0946  GLUCAP 125*    Recent Results (from the past 240 hour(s))  SARS CORONAVIRUS 2 (TAT 6-24 HRS) Nasopharyngeal Nasopharyngeal Swab     Status: Abnormal   Collection Time: 08/10/19 12:14 PM   Specimen: Nasopharyngeal Swab  Result Value Ref Range Status   SARS Coronavirus 2 POSITIVE (A) NEGATIVE Final    Comment: RESULT CALLED TO, READ BACK BY AND VERIFIED WITH: W OWENS,RN 1740 08/10/2019 D BRADLEY (NOTE) SARS-CoV-2 target nucleic acids are DETECTED. The SARS-CoV-2 RNA is generally detectable in upper and lower respiratory specimens during the acute phase of infection. Positive results are indicative of active infection with SARS-CoV-2. Clinical  correlation with patient history and other diagnostic information is necessary to determine patient infection status. Positive results do  not rule out bacterial  infection or co-infection with other viruses. The expected result is Negative. Fact Sheet for Patients: SugarRoll.be Fact Sheet for Healthcare Providers: https://www.woods-mathews.com/ This test is not yet approved or cleared by the [place] FDA and  has been authorized for detection and/or diagnosis of SARS-CoV-2 by FDA under an Emergency Use Authorization (EUA). This EUA will remain  in effect (meaning this test can be used) for  the duration of the COVID-19 declaration under Section 564(b)(1) of the Act, 21 U.S.C. section 360bbb-3(b)(1), unless the authorization is terminated or revoked sooner. Performed at [facility], [place] [address] [place], [place] [address]       Radiology Studies: Dg Chest Portable 1 View  Result Date: 08/10/2019 CLINICAL DATA:  Shortness of breath EXAM: PORTABLE CHEST 1 VIEW COMPARISON:  May 30, 2009 FINDINGS: There is no edema or consolidation. Heart is upper normal in size with pulmonary vascularity normal. No adenopathy. No bone lesions. IMPRESSION: No edema or consolidation. Heart upper normal in size. No evident adenopathy. Electronically Signed   By: Lowella Grip III M.D.   On: 08/10/2019 09:18  Scheduled Meds: . carbamide peroxide  5 drop Both EARS BID  . enoxaparin (LOVENOX) injection  40 mg Subcutaneous Q24H  . levothyroxine  100 mcg Oral Daily  . loratadine  10 mg Oral Daily  . magnesium oxide  400 mg Oral Daily  . pantoprazole  40 mg Oral Daily  . sodium chloride flush  3 mL Intravenous Q12H  . vitamin C  500 mg Oral Daily  . zinc sulfate  220 mg Oral Daily   Continuous Infusions:   LOS: 0 days     Vernell Leep, MD, [place], [facility]. [facility]  To contact the attending provider between 7A-7P or the covering provider during after hours 7P-7A, please log into the web site [email or web address] and access using universal [place] password for that web site. If  you do not have the password, please call the hospital operator.  08/11/2019, 4:39 PM

## 2019-08-11 NOTE — Evaluation (Signed)
Occupational Therapy Evaluation and Discharge Patient Details Name: [name] MRN: [id number] DOB: [birth year] Today's Date: 08/11/2019    History of Present Illness Pt is an 84 y/o female who presents with generalized weakness and low blood pressure. PMH significant for thyroid disease, sinusitis, breast CA s/p radiation, HTN, back pain (arthritis).   Clinical Impression   PTA Pt independent in ADL/IADL and mobility. She drives, works part time at [employer], enjoys spending time with family. Today she was able to perform bed mobility, orthostatic vitals (see below), toilet transfer, sink level grooming, LB dressing at mod I. She reports that she is fatigued more than normal - energy conservation education provided. At this time OT to sign off acutely as Pt is mod I and energy conservation edcuation complete. No DME needs as Pt has adequate DME at home.   Orthostatic Vitals taken during session:   08/11/2019  Orthostatic Lying   BP- Lying 126/70  Pulse- Lying 86  Orthostatic Sitting  BP- Sitting 134/72  Pulse- Sitting 96  Orthostatic Standing at 0 minutes  BP- Standing at 0 minutes 114/62  Pulse - Standing at 0 minutes 96  Orthostatic Standing at 3 min   BP- Standing at 3 minutes 109/64  Pulse - Standing at 3 minutes  102  Oxygen Therapy  SpO2 95 %  O2 Device Room Air       Follow Up Recommendations  No OT follow up    Equipment Recommendations  None recommended by OT(Pt has appropriate DME)    Recommendations for Other Services       Precautions / Restrictions Precautions Precautions: None Restrictions Weight Bearing Restrictions: No      Mobility Bed Mobility Overal bed mobility: Modified Independent Bed Mobility: Supine to Sit;Sit to Supine           General bed mobility comments: Pt was able to transition to/from EOB without assistance. Increased time to get settled back in bed and scoot up fully to [facility].   Transfers Overall transfer level: Modified  independent Equipment used: None Transfers: Sit to/from Stand           General transfer comment: No assist required. No unsteadiness noted. Increased time due to back pain.     Balance Overall balance assessment: Needs assistance Sitting-balance support: Feet supported;No upper extremity supported Sitting balance-Leahy Scale: Fair     Standing balance support: No upper extremity supported;During functional activity Standing balance-Leahy Scale: Fair Standing balance comment: functional grooming at sink without assist                           ADL either performed or assessed with clinical judgement   ADL Overall ADL's : At baseline                                       General ADL Comments: Pt reports increase in fatigue, but able to complete all ADL, transfers, sink level grooming, LB dressing, without physical assist, no LOB     Vision Patient Visual Report: No change from baseline       Perception     Praxis      Pertinent Vitals/Pain Pain Assessment: Faces Faces Pain Scale: No hurt Pain Intervention(s): Monitored during session;Repositioned     Hand Dominance Right   Extremity/Trunk Assessment Upper Extremity Assessment Upper Extremity Assessment: Overall WFL for tasks assessed  Lower Extremity Assessment Lower Extremity Assessment: Defer to PT evaluation   Cervical / Trunk Assessment Cervical / Trunk Assessment: Other exceptions Cervical / Trunk Exceptions: Forward head posture with rounded shoulders   Communication Communication Communication: HOH(Mild)   Cognition Arousal/Alertness: Awake/alert Behavior During Therapy: WFL for tasks assessed/performed Overall Cognitive Status: Within Functional Limits for tasks assessed                                     General Comments  Orthostatic vitals taken, O2 remained 95% and above on RA    Exercises     Shoulder Instructions      Home Living  Family/patient expects to be discharged to:: Private residence Living Arrangements: Alone Available Help at Discharge: Family;Available 24 hours/day Type of Home: House Home Access: Stairs to enter [employer] of Steps: 3 Entrance Stairs-Rails: Right;Left;Can reach both Home Layout: One level     Bathroom Shower/Tub: Occupational psychologist: Standard     Home Equipment: Environmental consultant - 2 wheels;Cane - single point;Shower seat;Bedside commode   Additional Comments: enjoys playing with grandchildren/great grandchildren and familyq      Prior Functioning/Environment Level of Independence: Independent        Comments: Drives, works part time at [employer] 2 days a week, indpendent with IADL's        OT Problem List: Decreased activity tolerance      OT Treatment/Interventions:      OT Goals(Current goals can be found in the care plan section) Acute Rehab OT Goals Patient Stated Goal: get my energy back OT Goal Formulation: With patient Time For Goal Achievement: 08/25/19 Potential to Achieve Goals: Good  OT Frequency:     Barriers to D/C:            Co-evaluation              AM-PAC OT "6 Clicks" Daily Activity     Outcome Measure Help from another person eating meals?: None Help from another person taking care of personal grooming?: None Help from another person toileting, which includes using toliet, bedpan, or urinal?: None Help from another person bathing (including washing, rinsing, drying)?: None Help from another person to put on and taking off regular upper body clothing?: None Help from another person to put on and taking off regular lower body clothing?: None 6 Click Score: 24   End of Session Equipment Utilized During Treatment: Gait belt Nurse Communication: Mobility status(ok to go to bathroom on her own)  Activity Tolerance: Patient tolerated treatment well Patient left: in bed;with call bell/phone within reach  OT Visit  Diagnosis: Muscle weakness (generalized) (M62.81)                Time: 1250-1315 OT Time Calculation (min): 25 min Charges:  OT General Charges $OT Visit: 1 Visit OT Evaluation $OT Eval Moderate Complexity: [place] OTR/L Acute Rehabilitation Services Pager: [phone] Office: [place] 08/11/2019, 2:03 PM

## 2019-08-11 NOTE — Plan of Care (Signed)

## 2019-08-12 DIAGNOSIS — E861 Hypovolemia: Secondary | ICD-10-CM

## 2019-08-12 DIAGNOSIS — I9589 Other hypotension: Principal | ICD-10-CM

## 2019-08-12 LAB — CBC
HCT: 39.9 % (ref 36.0–46.0)
Hemoglobin: 12.5 g/dL (ref 12.0–15.0)
MCH: 28 pg (ref 26.0–34.0)
MCHC: 31.3 g/dL (ref 30.0–36.0)
MCV: 89.3 fL (ref 80.0–100.0)
Platelets: 137 10*3/uL — ABNORMAL LOW (ref 150–400)
RBC: 4.47 MIL/uL (ref 3.87–5.11)
RDW: 15.1 % (ref 11.5–15.5)
WBC: 4.8 10*3/uL (ref 4.0–10.5)
nRBC: 0 % (ref 0.0–0.2)

## 2019-08-12 LAB — BASIC METABOLIC PANEL
Anion gap: 13 (ref 5–15)
BUN: 11 mg/dL (ref 8–23)
CO2: 23 mmol/L (ref 22–32)
Calcium: 8.9 mg/dL (ref 8.9–10.3)
Chloride: 100 mmol/L (ref 98–111)
Creatinine, Ser: 0.84 mg/dL (ref 0.44–1.00)
GFR calc Af Amer: >60 mL/min (ref >60)
GFR calc non Af Amer: >60 mL/min (ref >60)
Glucose, Bld: 100 mg/dL — ABNORMAL HIGH (ref 70–99)
Potassium: 3.9 mmol/L (ref 3.5–5.1)
Sodium: 136 mmol/L (ref 135–145)

## 2019-08-12 LAB — T3, FREE: T3, Free: 1.9 pg/mL — ABNORMAL LOW (ref 2.0–4.4)

## 2019-08-12 MED ORDER — ASCORBIC ACID 500 MG PO TABS: 500.0000 mg | ORAL_TABLET | Freq: Every day | ORAL | Status: AC

## 2019-08-12 MED ORDER — ZINC SULFATE 220 (50 ZN) MG PO CAPS: 220.0000 mg | ORAL_CAPSULE | Freq: Every day | ORAL | Status: AC

## 2019-08-12 MED ORDER — CARBAMIDE PEROXIDE 6.5 % OT SOLN: 5.0000 [drp] | Freq: Two times a day (BID) | OTIC | 0 refills | Status: DC

## 2019-08-12 NOTE — Progress Notes (Signed)
Pt voiced concerns whether they should start their b/p medications back today and when they should not take b/p medications. MD paged for clarification.

## 2019-08-12 NOTE — Discharge Summary (Signed)
Physician Discharge Summary  [name] [id number] DOB: October 09, 1934  PCP: Reynold Bowen, MD  Admitted from: Home Discharged to: Home  Admit date: 08/10/2019 Discharge date: 08/12/2019  Recommendations for Outpatient Follow-up:   Follow-up Information    Reynold Bowen, MD. Schedule an appointment as soon as possible for a visit in 1 week(s).   Specialty: Endocrinology Why: To be seen with repeat labs (CBC & BMP). Contact information: [address] [place] [address] [place]: None Equipment/Devices: None  Discharge Condition: Improved and stable CODE STATUS: Full Diet recommendation: Heart healthy diet  Discharge Diagnoses:  Principal Problem:   Hypotension Active Problems:   Dizziness   COVID-19 virus infection   AKI (acute kidney injury) (HCC)   Hypothyroidism   GERD (gastroesophageal reflux disease)   Cerumen impaction   Brief Summary: 84 year old female, lives alone, independent, works from home and volunteers at [employer], PMH of GERD, hyperlipidemia, hypertension, sleep apnea, hypothyroidism presented to the ED with worsening dizziness which had occurred once or twice over the last 2 months.  She also reported some URTI-like symptoms, chills and chronic diarrhea for which she takes Metamucil.  Admitted for hypotension secondary to dehydration, acute kidney injury and Covid positive.   Assessment & Plan:  Transient hypotension and orthostatic dizziness/dehydration  Suspected due to dehydration from ongoing losartan-HCTZ use, poor oral intake, some diarrhea and Covid.  Blood pressure in the 80s/50s on admission.  Hydrated with IV fluids.  Hypotension had resolved but still had mild orthostatic changes yesterday i.e. SBP dropped from 134/72 sitting >114/62 standing.   Thereby continued gentle IV fluids for additional 24 hours.  Orthostatic blood pressure changes have resolved.  Patient no longer is dizzy on  ambulating.  Counseled her that she can continue to hold her antihypertensives for 2 to 3 days and resume as her appetite gets better and home BP monitoring shows BP >140/80.  She has a BP machine at home.  She verbalized understanding.  COVID-19 infection  Reported intermittent cough.  Chest x-ray clear.  No hypoxia.  No indication for steroids.  Remdesivir not initiated.  Continue vitamin C and zinc.    Inflammatory markers unremarkable.  I discussed in detail with one of my colleague physicians at the [facility], reviewed case in detail who agrees regarding not initiating steroids or Remdesivir given her clinical picture.  Patient continued to be stable from the standpoint.  She did have some decreased appetite and diarrhea up to 1 time daily which appears to be chronic.  She was counseled to self isolate/quarantine for the next 10 to 14 days given mild infection or even asymptomatic status.  Acute kidney injury  Baseline creatinine 0.8.  Presented with creatinine of 1.32.  Suspected due to dehydration, diuretics and ARB use.  Held Hyzaar.  Hydrated with IV fluids.  Creatinine normalized.  Follow-up BMP in a week's time as outpatient.  Hypothyroid  Mildly abnormal TFTs. TSH low at 0.275, free T4: 0.98 and free T3: 1.9.  Clinically appears euthyroid.  Consider repeating TFTs in 4 to 6 weeks.  GERD  Continue PPI.  Cerumen impaction  No earache.  Continue Debrox eardrops.  Outpatient follow-up with PCP.  Atypical chest pain  Patient reportedly woke up from a nap on 11/20 afternoon and complained of substernal dull ache and when she turned around, reportedly radiated to the back.  EKG without acute changes.  HS troponin x2, negative.  Chest pain resolved after a dose of Tylenol without recurrence.  Body mass index is 34.88 kg/m./Obesity  Anemia and thrombocytopenia  Hemoglobin back to normal.  Thrombocytopenia gradually improving.  Follow CBC as  outpatient.   Consultants:  None  Procedures:  None   Discharge Instructions  Discharge Instructions    Call MD for:  difficulty breathing, headache or visual disturbances   Complete by: As directed    Call MD for:  extreme fatigue   Complete by: As directed    Call MD for:  persistant dizziness or light-headedness   Complete by: As directed    Call MD for:  persistant nausea and vomiting   Complete by: As directed    Call MD for:  severe uncontrolled pain   Complete by: As directed    Call MD for:  temperature >100.4   Complete by: As directed    Diet - low sodium heart healthy   Complete by: As directed    Discharge instructions   Complete by: As directed    Continue to hold your blood pressure medication for 2 to 3 days.  You may resume it when you are appetite gets better and home BP checks are greater than 140/80 mmHg.  Please self isolate/quarantine for the next 10 to 14 days due to your Covid infection.   Increase activity slowly   Complete by: As directed        Medication List    TAKE these medications   APPLE CIDER VINEGAR PO Take 15 mLs by mouth daily.   ascorbic acid 500 MG tablet Commonly known as: VITAMIN C Take 1 tablet (500 mg total) by mouth daily. Start taking on: August 13, 2019   BLUE-EMU SUPER STRENGTH EX Apply 1 application topically 4 (four) times daily.   CALCIUM 1200 PO Take 1,200 mg by mouth daily.   carbamide peroxide 6.5 % OTIC solution Commonly known as: DEBROX Place 5 drops into both ears 2 (two) times daily.   cetirizine 10 MG tablet Commonly known as: ZYRTEC Take 10 mg by mouth daily as needed for allergies.   clindamycin 1 % gel Commonly known as: CLINDAGEL Apply topically 2 (two) times daily.   Finacea 15 % cream Generic drug: Azelaic Acid Apply 1 application topically 2 (two) times daily. face   ICAPS PO Take 1 capsule by mouth 2 (two) times daily.   levothyroxine 100 MCG tablet Commonly known as:  SYNTHROID Take 100 mcg by mouth daily.   losartan-hydrochlorothiazide 100-12.5 MG tablet Commonly known as: HYZAAR Take 1 tablet by mouth daily.   Magnesium 250 MG Tabs Take 500 mg by mouth daily.   meloxicam 15 MG tablet Commonly known as: MOBIC Take 15 mg by mouth daily as needed for pain.   methocarbamol 500 MG tablet Commonly known as: ROBAXIN Take 500 mg by mouth daily as needed for muscle spasms.   niacin 500 MG tablet Take 500 mg by mouth daily.   Omega-3 Krill Oil 500 MG Caps Take 1 capsule by mouth 2 (two) times daily.   omeprazole 20 MG capsule Commonly known as: PRILOSEC Take 20 mg by mouth daily.   Tylenol 8 Hour Arthritis Pain 650 MG CR tablet Generic drug: acetaminophen Take 1,300 mg by mouth every 8 (eight) hours as needed for pain.   VITAMIN D PO Take 5,000 mg by mouth daily.   VITAMIN E PO Take 1 capsule by mouth daily.   Voltaren 1 % Gel Generic drug: diclofenac Sodium Apply 2  g topically 4 (four) times daily.   zinc sulfate 220 (50 Zn) MG capsule Take 1 capsule (220 mg total) by mouth daily. Start taking on: August 13, 2019      Allergies  Allergen Reactions  . Sulfa Antibiotics Hives, Itching and Rash  . Doryx [Doxycycline] Swelling  . Exemestane Itching  . Femara [Letrozole] Other (See Comments)    Aching joints   . Penicillins Itching    Did it involve swelling of the face/tongue/throat, SOB, or low BP? No Did it involve sudden or severe rash/hives, skin peeling, or any reaction on the inside of your mouth or nose? Yes Did you need to seek medical attention at a hospital or doctor's office? Yes When did it last happen?unable to recall If all above answers are "NO", may proceed with cephalosporin use.   Wynona Neat [Cefixime] Itching      Procedures/Studies: Dg Chest Portable 1 View  Result Date: 08/10/2019 CLINICAL DATA:  Shortness of breath EXAM: PORTABLE CHEST 1 VIEW COMPARISON:  May 30, 2009 FINDINGS: There is  no edema or consolidation. Heart is upper normal in size with pulmonary vascularity normal. No adenopathy. No bone lesions. IMPRESSION: No edema or consolidation. Heart upper normal in size. No evident adenopathy. Electronically Signed   By: Lowella Grip III M.D.   On: 08/10/2019 09:18     Subjective: Overall patient feels much better compared to admission.  No dizziness even while ambulating in the room.  Appetite still poor.  Denies loss of taste.  Encouraged nutritional supplements intake.  Reports one episode of diarrhea "everything goes through" after eating something.  No abdominal pain.  Denies cough, chest pain, dyspnea, dizziness, lightheadedness or fever.  As per RN, no acute issues noted.  Discharge Exam:  Vitals:   08/12/19 0821 08/12/19 1046 08/12/19 1048 08/12/19 1050  BP: 132/71 117/68 123/72 122/60  Pulse: 80 83 87 89  Resp: 20     Temp: 98.6 F (37 C)     TempSrc: Oral     SpO2: 95%     Weight:      Height:         General exam: Pleasant elderly female, moderately built and obese lying comfortably propped up in bed without distress.  Oral mucosa moist Respiratory system: Clear to auscultation.  No increased work of breathing. Cardiovascular system: S1 & S2 heard, RRR. No JVD, murmurs, rubs, gallops or clicks. No pedal edema.  Telemetry personally reviewed: Sinus rhythm. Gastrointestinal system: Abdomen is nondistended, soft and nontender. No organomegaly or masses felt. Normal bowel sounds heard. Central nervous system: Alert and oriented. No focal neurological deficits. Extremities: Symmetric 5 x 5 power. Skin: No rashes, lesions or ulcers Psychiatry: Judgement and insight appear normal. Mood & affect appropriate.      The results of significant diagnostics from this hospitalization (including imaging, microbiology, ancillary and laboratory) are listed below for reference.     Microbiology: Recent Results (from the past 240 hour(s))  SARS CORONAVIRUS 2  (TAT 6-24 HRS) Nasopharyngeal Nasopharyngeal Swab     Status: Abnormal   Collection Time: 08/10/19 12:14 PM   Specimen: Nasopharyngeal Swab  Result Value Ref Range Status   SARS Coronavirus 2 POSITIVE (A) NEGATIVE Final    Comment: RESULT CALLED TO, READ BACK BY AND VERIFIED WITH: W OWENS,RN 1740 08/10/2019 D BRADLEY (NOTE) SARS-CoV-2 target nucleic acids are DETECTED. The SARS-CoV-2 RNA is generally detectable in upper and lower respiratory specimens during the acute phase of infection. Positive results are  indicative of active infection with SARS-CoV-2. Clinical  correlation with patient history and other diagnostic information is necessary to determine patient infection status. Positive results do  not rule out bacterial infection or co-infection with other viruses. The expected result is Negative. Fact Sheet for Patients: SugarRoll.be Fact Sheet for Healthcare Providers: https://www.woods-mathews.com/ This test is not yet approved or cleared by the [place] FDA and  has been authorized for detection and/or diagnosis of SARS-CoV-2 by FDA under an Emergency Use Authorization (EUA). This EUA will remain  in effect (meaning this test can be used) for  the duration of the COVID-19 declaration under Section 564(b)(1) of the Act, 21 U.S.C. section 360bbb-3(b)(1), unless the authorization is terminated or revoked sooner. Performed at [facility], [place] [address] [place], [place] [address]      Labs: CBC: Recent Labs  Lab 08/10/19 0911 08/11/19 0425 08/12/19 0411  WBC 6.6 5.0 4.8  NEUTROABS 4.9  --   --   HGB 12.3 11.6* 12.5  HCT 40.3 36.1 39.9  MCV 92.0 87.8 89.3  PLT 132* 120* 137*    Basic Metabolic Panel: Recent Labs  Lab 08/10/19 0911 08/11/19 0425 08/12/19 0411  NA 134* 134* 136  K 4.4 4.1 3.9  CL 96* 100 100  CO2 26 23 23   GLUCOSE 145* 132* 100*  BUN 19 14 11   CREATININE 1.32* 0.96 0.84  CALCIUM 9.1  8.6* 8.9  MG  --  1.8  --   PHOS  --  3.3  --     Liver Function Tests: Recent Labs  Lab 08/11/19 0425  AST 31  ALT 25  ALKPHOS 60  BILITOT 0.8  PROT 6.7  ALBUMIN 3.3*    CBG: Recent Labs  Lab 08/10/19 0946  GLUCAP 125*     Thyroid function studies Recent Labs    08/10/19 1010 08/11/19 0726  TSH 0.275*  --   T3FREE  --  1.9*    Anemia work up Recent Labs    08/11/19 0425  FERRITIN 166    Urinalysis    Component Value Date/Time   COLORURINE YELLOW 08/10/2019 [place] 08/10/2019 2220   LABSPEC 1.010 08/10/2019 2220   PHURINE 6.0 08/10/2019 2220   [place] 08/10/2019 2220   HGBUR NEGATIVE 08/10/2019 2220   BILIRUBINUR NEGATIVE 08/10/2019 2220   KETONESUR NEGATIVE 08/10/2019 2220   PROTEINUR NEGATIVE 08/10/2019 2220   NITRITE NEGATIVE 08/10/2019 2220   LEUKOCYTESUR TRACE (A) 08/10/2019 2220    I offered to speak with patient's family to update care but patient kindly declined.  Time coordinating discharge: 40 minutes  SIGNED:  Vernell Leep, MD, [place], [facility]. [facility]  To contact the attending provider between 7A-7P or the covering provider during after hours 7P-7A, please log into the web site [email or web address] and access using universal [place] password for that web site. If you do not have the password, please call the hospital operator.

## 2019-08-12 NOTE — Discharge Instructions (Signed)

## 2019-08-13 LAB — UREA NITROGEN, URINE: Urea Nitrogen, Ur: 319 mg/dL

## 2020-03-08 ENCOUNTER — Other Ambulatory Visit: Payer: Self-pay | Admitting: Endocrinology

## 2020-03-08 DIAGNOSIS — Z1231 Encounter for screening mammogram for malignant neoplasm of breast: Secondary | ICD-10-CM

## 2020-03-08 DIAGNOSIS — C50912 Malignant neoplasm of unspecified site of left female breast: Secondary | ICD-10-CM

## 2020-03-08 DIAGNOSIS — M810 Age-related osteoporosis without current pathological fracture: Secondary | ICD-10-CM

## 2020-07-29 ENCOUNTER — Ambulatory Visit
Admission: RE | Admit: 2020-07-29 | Discharge: 2020-07-29 | Disposition: A | Discharge status: Home / Self Care | Payer: Medicare Other | Source: Ambulatory Visit | Attending: Endocrinology | Admitting: Endocrinology

## 2020-07-29 ENCOUNTER — Other Ambulatory Visit: Payer: Self-pay

## 2020-07-29 DIAGNOSIS — C50912 Malignant neoplasm of unspecified site of left female breast: Secondary | ICD-10-CM

## 2020-07-29 DIAGNOSIS — M810 Age-related osteoporosis without current pathological fracture: Secondary | ICD-10-CM

## 2020-07-29 DIAGNOSIS — Z1231 Encounter for screening mammogram for malignant neoplasm of breast: Secondary | ICD-10-CM

## 2021-02-10 ENCOUNTER — Other Ambulatory Visit: Payer: Self-pay | Admitting: Endocrinology

## 2021-02-10 DIAGNOSIS — Z1231 Encounter for screening mammogram for malignant neoplasm of breast: Secondary | ICD-10-CM

## 2021-07-30 ENCOUNTER — Ambulatory Visit: Payer: Medicare Other

## 2021-09-24 ENCOUNTER — Ambulatory Visit: Payer: Medicare Other

## 2021-09-29 ENCOUNTER — Ambulatory Visit
Admission: RE | Admit: 2021-09-29 | Discharge: 2021-09-29 | Disposition: A | Discharge status: Home / Self Care | Payer: Medicare Other | Source: Ambulatory Visit | Attending: Endocrinology | Admitting: Endocrinology

## 2021-09-29 DIAGNOSIS — Z1231 Encounter for screening mammogram for malignant neoplasm of breast: Secondary | ICD-10-CM

## 2022-03-23 ENCOUNTER — Other Ambulatory Visit: Payer: Self-pay | Admitting: Endocrinology

## 2022-03-23 DIAGNOSIS — Z1231 Encounter for screening mammogram for malignant neoplasm of breast: Secondary | ICD-10-CM

## 2022-09-30 ENCOUNTER — Ambulatory Visit: Payer: Medicare Other

## 2022-11-23 ENCOUNTER — Ambulatory Visit
Admission: RE | Admit: 2022-11-23 | Discharge: 2022-11-23 | Disposition: A | Discharge status: Home / Self Care | Payer: Medicare Other | Source: Ambulatory Visit | Attending: Endocrinology | Admitting: Endocrinology

## 2022-11-23 DIAGNOSIS — Z1231 Encounter for screening mammogram for malignant neoplasm of breast: Secondary | ICD-10-CM

## 2023-06-10 ENCOUNTER — Encounter (HOSPITAL_COMMUNITY): Payer: Self-pay | Admitting: Emergency Medicine

## 2023-06-10 ENCOUNTER — Observation Stay (HOSPITAL_BASED_OUTPATIENT_CLINIC_OR_DEPARTMENT_OTHER): Payer: Medicare Other

## 2023-06-10 ENCOUNTER — Emergency Department (HOSPITAL_COMMUNITY): Payer: Medicare Other

## 2023-06-10 ENCOUNTER — Other Ambulatory Visit: Payer: Self-pay

## 2023-06-10 ENCOUNTER — Observation Stay (HOSPITAL_COMMUNITY)
Admission: EM | Admit: 2023-06-10 | Discharge: 2023-06-11 | Disposition: A | Discharge status: Home / Self Care | Payer: Medicare Other | Attending: Internal Medicine | Admitting: Internal Medicine

## 2023-06-10 DIAGNOSIS — Z79899 Other long term (current) drug therapy: Secondary | ICD-10-CM | POA: Insufficient documentation

## 2023-06-10 DIAGNOSIS — R2689 Other abnormalities of gait and mobility: Secondary | ICD-10-CM | POA: Insufficient documentation

## 2023-06-10 DIAGNOSIS — Z1152 Encounter for screening for COVID-19: Secondary | ICD-10-CM | POA: Diagnosis not present

## 2023-06-10 DIAGNOSIS — Z853 Personal history of malignant neoplasm of breast: Secondary | ICD-10-CM | POA: Insufficient documentation

## 2023-06-10 DIAGNOSIS — R55 Syncope and collapse: Principal | ICD-10-CM

## 2023-06-10 DIAGNOSIS — R9431 Abnormal electrocardiogram [ECG] [EKG]: Secondary | ICD-10-CM | POA: Diagnosis not present

## 2023-06-10 DIAGNOSIS — R350 Frequency of micturition: Secondary | ICD-10-CM

## 2023-06-10 DIAGNOSIS — I10 Essential (primary) hypertension: Secondary | ICD-10-CM | POA: Diagnosis not present

## 2023-06-10 DIAGNOSIS — I493 Ventricular premature depolarization: Secondary | ICD-10-CM

## 2023-06-10 DIAGNOSIS — E039 Hypothyroidism, unspecified: Secondary | ICD-10-CM

## 2023-06-10 LAB — COMPREHENSIVE METABOLIC PANEL
ALT: 17 U/L (ref 0–44)
AST: 21 U/L (ref 15–41)
Albumin: 3.4 g/dL — ABNORMAL LOW (ref 3.5–5.0)
Alkaline Phosphatase: 76 U/L (ref 38–126)
Anion gap: 15 (ref 5–15)
BUN: 23 mg/dL (ref 8–23)
CO2: 22 mmol/L (ref 22–32)
Calcium: 9 mg/dL (ref 8.9–10.3)
Chloride: 100 mmol/L (ref 98–111)
Creatinine, Ser: 0.83 mg/dL (ref 0.44–1.00)
GFR, Estimated: >60 mL/min (ref >60)
Glucose, Bld: 131 mg/dL — ABNORMAL HIGH (ref 70–99)
Potassium: 3.8 mmol/L (ref 3.5–5.1)
Sodium: 137 mmol/L (ref 135–145)
Total Bilirubin: 0.9 mg/dL (ref 0.3–1.2)
Total Protein: 6.6 g/dL (ref 6.5–8.1)

## 2023-06-10 LAB — CBG MONITORING, ED
Glucose-Capillary: 134 mg/dL — ABNORMAL HIGH (ref 70–99)
Glucose-Capillary: 102 mg/dL — ABNORMAL HIGH (ref 70–99)

## 2023-06-10 LAB — ECHOCARDIOGRAM COMPLETE
Area-P 1/2: 4.21 cm2
Height: 62 in
MV M vel: 2.72 m/s
MV Peak grad: 29.6 mmHg
S' Lateral: 2.9 cm
Weight: 2704 oz

## 2023-06-10 LAB — CBC WITH DIFFERENTIAL/PLATELET
Abs Immature Granulocytes: 0.04 10*3/uL (ref 0.00–0.07)
Basophils Absolute: 0 10*3/uL (ref 0.0–0.1)
Basophils Relative: 0 %
Eosinophils Absolute: 0 10*3/uL (ref 0.0–0.5)
Eosinophils Relative: 1 %
HCT: 39.5 % (ref 36.0–46.0)
Hemoglobin: 12 g/dL (ref 12.0–15.0)
Immature Granulocytes: 1 %
Lymphocytes Relative: 21 %
Lymphs Abs: 1.5 10*3/uL (ref 0.7–4.0)
MCH: 27.2 pg (ref 26.0–34.0)
MCHC: 30.4 g/dL (ref 30.0–36.0)
MCV: 89.6 fL (ref 80.0–100.0)
Monocytes Absolute: 0.5 10*3/uL (ref 0.1–1.0)
Monocytes Relative: 7 %
Neutro Abs: 5 10*3/uL (ref 1.7–7.7)
Neutrophils Relative %: 70 %
Platelets: 151 10*3/uL (ref 150–400)
RBC: 4.41 MIL/uL (ref 3.87–5.11)
RDW: 14.1 % (ref 11.5–15.5)
WBC: 7 10*3/uL (ref 4.0–10.5)
nRBC: 0 % (ref 0.0–0.2)

## 2023-06-10 LAB — TROPONIN I (HIGH SENSITIVITY)
Troponin I (High Sensitivity): 5 ng/L (ref <18)
Troponin I (High Sensitivity): 4 ng/L (ref <18)

## 2023-06-10 LAB — MAGNESIUM: Magnesium: 2 mg/dL (ref 1.7–2.4)

## 2023-06-10 LAB — TSH: TSH: 1.151 u[IU]/mL (ref 0.350–4.500)

## 2023-06-10 LAB — D-DIMER, QUANTITATIVE: D-Dimer, Quant: 0.29 ug/mL-FEU (ref 0.00–0.50)

## 2023-06-10 LAB — SARS CORONAVIRUS 2 BY RT PCR: SARS Coronavirus 2 by RT PCR: NEGATIVE

## 2023-06-10 MED ORDER — ALBUTEROL SULFATE (2.5 MG/3ML) 0.083% IN NEBU: 2.5000 mg | INHALATION_SOLUTION | Freq: Four times a day (QID) | RESPIRATORY_TRACT | Status: DC | PRN

## 2023-06-10 MED ORDER — ACETAMINOPHEN 650 MG RE SUPP: 650.0000 mg | Freq: Four times a day (QID) | RECTAL | Status: DC | PRN

## 2023-06-10 MED ORDER — LEVOTHYROXINE SODIUM 25 MCG PO TABS: 50.0000 ug | ORAL_TABLET | ORAL | Status: DC

## 2023-06-10 MED ORDER — SODIUM CHLORIDE 0.9% FLUSH
3.0000 mL | Freq: Two times a day (BID) | INTRAVENOUS | Status: DC
  Administered 2023-06-10 (×2): 3 mL via INTRAVENOUS

## 2023-06-10 MED ORDER — DICLOFENAC SODIUM 1 % EX GEL
2.0000 g | Freq: Four times a day (QID) | CUTANEOUS | Status: DC
  Administered 2023-06-10: 2 g via TOPICAL
  Filled 2023-06-10: qty 100

## 2023-06-10 MED ORDER — LORATADINE 10 MG PO TABS: 10.0000 mg | ORAL_TABLET | Freq: Every day | ORAL | Status: DC | PRN

## 2023-06-10 MED ORDER — ENOXAPARIN SODIUM 40 MG/0.4ML IJ SOSY
40.0000 mg | PREFILLED_SYRINGE | INTRAMUSCULAR | Status: DC
  Administered 2023-06-10: 40 mg via SUBCUTANEOUS
  Filled 2023-06-10: qty 0.4

## 2023-06-10 MED ORDER — ONDANSETRON HCL 4 MG PO TABS: 4.0000 mg | ORAL_TABLET | Freq: Four times a day (QID) | ORAL | Status: DC | PRN

## 2023-06-10 MED ORDER — POLYETHYL GLYCOL-PROPYL GLYCOL 0.4-0.3 % OP GEL
Freq: Two times a day (BID) | OPHTHALMIC | Status: DC
  Filled 2023-06-10: qty 10

## 2023-06-10 MED ORDER — ONDANSETRON HCL 4 MG/2ML IJ SOLN: 4.0000 mg | Freq: Four times a day (QID) | INTRAMUSCULAR | Status: DC | PRN

## 2023-06-10 MED ORDER — LACTATED RINGERS IV BOLUS
1000.0000 mL | Freq: Once | INTRAVENOUS | Status: AC
  Administered 2023-06-10: 1000 mL via INTRAVENOUS

## 2023-06-10 MED ORDER — SODIUM CHLORIDE 0.9 % IV SOLN: INTRAVENOUS | Status: DC

## 2023-06-10 MED ORDER — LEVOTHYROXINE SODIUM 100 MCG PO TABS
100.0000 ug | ORAL_TABLET | ORAL | Status: DC
  Administered 2023-06-11: 100 ug via ORAL
  Filled 2023-06-10: qty 1

## 2023-06-10 MED ORDER — LEVOTHYROXINE SODIUM 50 MCG PO TABS: 50.0000 ug | ORAL_TABLET | ORAL | Status: DC

## 2023-06-10 MED ORDER — PANTOPRAZOLE SODIUM 40 MG PO TBEC
40.0000 mg | DELAYED_RELEASE_TABLET | Freq: Every day | ORAL | Status: DC
  Administered 2023-06-10 – 2023-06-11 (×2): 40 mg via ORAL
  Filled 2023-06-10 (×2): qty 1

## 2023-06-10 MED ORDER — ACETAMINOPHEN 325 MG PO TABS
650.0000 mg | ORAL_TABLET | Freq: Four times a day (QID) | ORAL | Status: DC | PRN
  Filled 2023-06-10: qty 2

## 2023-06-10 MED ORDER — ARTIFICIAL TEARS OPHTHALMIC OINT
TOPICAL_OINTMENT | Freq: Two times a day (BID) | OPHTHALMIC | Status: DC
  Filled 2023-06-10: qty 3.5

## 2023-06-10 NOTE — ED Triage Notes (Signed)
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 Pt was on her feet for around 2 hours, began to feel like she was going to pass out and lowered herself to the floor. Nausea and lightheadedness. Denies pain.  EMS gave 200 cc NS and 4 mg zofran.

## 2023-06-10 NOTE — ED Notes (Signed)
ED TO INPATIENT HANDOFF REPORT  ED Nurse Name and Phone #: [phone]  S Name/Age/Gender [name] 88 y.o. female Room/Bed: 015C/015C  Code Status   Code Status: Prior  Home/SNF/Other Home Patient oriented to: self, place, time, and situation Is this baseline? Yes   Triage Complete: Triage complete  Chief Complaint Near syncope [R55]  Triage Note Pt [place] via EMS from church where she was preparing meals. Pt was on her feet for around 2 hours, began to feel like she was going to pass out and lowered herself to the floor. Nausea and lightheadedness. Denies pain.  EMS gave 200 cc NS and 4 mg zofran.    Allergies Allergies  Allergen Reactions   Sulfa Antibiotics Hives, Itching and Rash   Doryx [Doxycycline] Swelling   Exemestane Itching   Femara [Letrozole] Other (See Comments)    Aching joints    Penicillins Itching    Did it involve swelling of the face/tongue/throat, SOB, or low BP? No Did it involve sudden or severe rash/hives, skin peeling, or any reaction on the inside of your mouth or nose? Yes Did you need to seek medical attention at a hospital or doctor's office? Yes When did it last happen? unable to recall      If all above answers are "NO", may proceed with cephalosporin use.    Suprax [Cefixime] Itching    Level of Care/Admitting Diagnosis ED Disposition     ED Disposition  Admit   Condition  --   Comment  Hospital Area: [facility] [[id number]]  Level of Care: Telemetry Medical [104]  May place patient in observation at [facility] or [facility] if equivalent level of care is available:: No  Covid Evaluation: Asymptomatic - no recent exposure (last 10 days) testing not required  Diagnosis: Near syncope [phone]  Admitting Physician: Clydie Braun [[id number]]  Attending Physician: Clydie Braun [[id number]]          B Medical/Surgery History Past Medical History:  Diagnosis Date   Arthritis    Arthritis pain     Breast cancer (HCC) 2010   left breast   Cancer (HCC) 04/2009   Left breast cancer   Cough    Cryptosporidial gastroenteritis (HCC)    GERD (gastroesophageal reflux disease)    Hyperlipidemia    Hypertension    Nasal congestion    Personal history of radiation therapy    Sinusitis    Sleep apnea    Thyroid disease    Past Surgical History:  Procedure Laterality Date   BREAST BIOPSY     BREAST LUMPECTOMY Left 2010   BREAST LUMPECTOMY W/ NEEDLE LOCALIZATION Left 06/04/2009   BREAST SURGERY     CARPAL TUNNEL RELEASE     CHOLECYSTECTOMY     DILATION AND CURETTAGE OF UTERUS     ROTATOR CUFF REPAIR     stomach polyps     TONSILLECTOMY       A IV Location/Drains/Wounds Patient Lines/Drains/Airways Status     Active Line/Drains/Airways     Name Placement date Placement time Site Days   Peripheral IV 06/10/23 20 G Anterior;Right Forearm 06/10/23  0801  Forearm  less than 1            Intake/Output Last 24 hours No intake or output data in the 24 hours ending 06/10/23 1202  Labs/Imaging Results for orders placed or performed during the hospital encounter of 06/10/23 (from the past 48 hour(s))  CBG monitoring, ED  Status: Abnormal   Collection Time: 06/10/23  8:02 AM  Result Value Ref Range   Glucose-Capillary 134 (H) 70 - 99 mg/dL    Comment: Glucose reference range applies only to samples taken after fasting for at least 8 hours.   Comment 1 Notify RN    Comment 2 Document in Chart   SARS Coronavirus 2 by RT PCR (hospital order, performed in [facility] hospital lab) *cepheid single result test* Anterior Nasal Swab     Status: None   Collection Time: 06/10/23  8:09 AM   Specimen: Anterior Nasal Swab  Result Value Ref Range   SARS Coronavirus 2 by RT PCR NEGATIVE NEGATIVE    Comment: Performed at [facility], [address], [place], [place] [address]  CBC with Differential     Status: None   Collection Time: 06/10/23  8:16 AM  Result Value Ref Range    WBC 7.0 4.0 - 10.5 K/uL   RBC 4.41 3.87 - 5.11 MIL/uL   Hemoglobin 12.0 12.0 - 15.0 g/dL   HCT 98.3 38.2 - 50.5 %   MCV 89.6 80.0 - 100.0 fL   MCH 27.2 26.0 - 34.0 pg   MCHC 30.4 30.0 - 36.0 g/dL   RDW 39.7 67.3 - 41.9 %   Platelets 151 150 - 400 K/uL   nRBC 0.0 0.0 - 0.2 %   Neutrophils Relative % 70 %   Neutro Abs 5.0 1.7 - 7.7 K/uL   Lymphocytes Relative 21 %   Lymphs Abs 1.5 0.7 - 4.0 K/uL   Monocytes Relative 7 %   Monocytes Absolute 0.5 0.1 - 1.0 K/uL   Eosinophils Relative 1 %   Eosinophils Absolute 0.0 0.0 - 0.5 K/uL   Basophils Relative 0 %   Basophils Absolute 0.0 0.0 - 0.1 K/uL   Immature Granulocytes 1 %   Abs Immature Granulocytes 0.04 0.00 - 0.07 K/uL    Comment: Performed at [facility], [address], [place], [place] [address]  Comprehensive metabolic panel     Status: Abnormal   Collection Time: 06/10/23  8:16 AM  Result Value Ref Range   Sodium 137 135 - 145 mmol/L   Potassium 3.8 3.5 - 5.1 mmol/L   Chloride 100 98 - 111 mmol/L   CO2 22 22 - 32 mmol/L   Glucose, Bld 131 (H) 70 - 99 mg/dL    Comment: Glucose reference range applies only to samples taken after fasting for at least 8 hours.   BUN 23 8 - 23 mg/dL   Creatinine, Ser 4.09 0.44 - 1.00 mg/dL   Calcium 9.0 8.9 - 73.5 mg/dL   Total Protein 6.6 6.5 - 8.1 g/dL   Albumin 3.4 (L) 3.5 - 5.0 g/dL   AST 21 15 - 41 U/L   ALT 17 0 - 44 U/L   Alkaline Phosphatase 76 38 - 126 U/L   Total Bilirubin 0.9 0.3 - 1.2 mg/dL   GFR, Estimated >32 >99 mL/min    Comment: (NOTE) Calculated using the CKD-EPI Creatinine Equation (2021)    Anion gap 15 5 - 15    Comment: Performed at [facility], [address], [place], [place] [address]  Troponin I (High Sensitivity)     Status: None   Collection Time: 06/10/23  8:16 AM  Result Value Ref Range   Troponin I (High Sensitivity) 5 <18 ng/L    Comment: (NOTE) Elevated high sensitivity troponin I (hsTnI) values and significant  changes  across serial  measurements may suggest ACS but many other  chronic and acute conditions are known to elevate hsTnI results.  Refer to the "Links" section for chest pain algorithms and additional  guidance. Performed at [facility], [address], [place], [place] [address]   TSH     Status: None   Collection Time: 06/10/23  8:16 AM  Result Value Ref Range   TSH 1.151 0.350 - 4.500 uIU/mL    Comment: Performed by a 3rd Generation assay with a functional sensitivity of <=0.01 uIU/mL. Performed at [facility], [address] [place], [place] [address]    DG Chest Portable 1 View  Result Date: 06/10/2023 CLINICAL DATA:  near syncope EXAM: PORTABLE CHEST 1 VIEW COMPARISON:  Chest x-ray August 10, 2019. FINDINGS: The heart size and mediastinal contours are within normal limits. Both lungs are clear. No visible pleural effusions or pneumothorax. No acute osseous abnormality. IMPRESSION: No active disease. Electronically Signed   By: Feliberto Harts M.D.   On: 06/10/2023 08:27    Pending Labs Unresulted Labs (From admission, onward)    None       Vitals/Pain Today's Vitals   06/10/23 1030 06/10/23 1045 06/10/23 1100 06/10/23 1130  BP: 122/70 124/67 126/68 125/71  Pulse: 77 77 81 81  Resp: (!) 22 (!) 8 20 16   Temp:      TempSrc:      SpO2: 97% 98% 96% 99%  Weight:      Height:      PainSc:        Isolation Precautions Airborne and Contact precautions  Medications Medications  lactated ringers bolus 1,000 mL (1,000 mLs Intravenous New Bag/Given 06/10/23 0916)    Mobility walks     Focused Assessments Cardiac Assessment Handoff:    No results found for: "CKTOTAL", "CKMB", "CKMBINDEX", "TROPONINI" Lab Results  Component Value Date   DDIMER 0.37 08/11/2019   Does the Patient currently have chest pain? No    R Recommendations: See Admitting Provider Note  Report given to:   Additional Notes: [id number]

## 2023-06-10 NOTE — Progress Notes (Signed)
  Echocardiogram 2D Echocardiogram has been performed.  [name] 06/10/2023, 4:30 PM

## 2023-06-10 NOTE — ED Provider Notes (Signed)
[name] Provider Note   CSN: [id number] Arrival date & time: 06/10/23  0753     History HTN, Thyroid  Chief Complaint  Patient presents with   Near Syncope    [name] is a 88 y.o. female.  88 y.o female with a PMH of HTN, Thyroid disease presents to the ED with chief complaint of near syncope.  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  Reports she did not lose consciousness, she did not strike her head.  She did have breakfast this morning consisting of a protein bar and a coffee along with taking all her medication, this is not her regular breakfast.  She does report feeling overall dizzy and weak.  Has not had any sickness recently.  At the scene patient was found to be hypotensive, provided with 200 cc of normal saline by EMS along with 4 mg of Zofran.  Patient was endorsing some pain along the right lower quadrant, although mild at the moment, she feels like this is the only thing that is different this morning.  She arrived to the ED with a systolic in the 100s diastolic in the 50s.  She denies any recent illness, no chest pain, not short of breath, no headache.  The history is provided by the patient.  Near Syncope Associated symptoms include abdominal pain. Pertinent negatives include no chest pain and no shortness of breath.       Home Medications Prior to Admission medications   Medication Sig Start Date End Date Taking? Authorizing Provider  acetaminophen (TYLENOL 8 HOUR ARTHRITIS PAIN) 650 MG CR tablet Take 1,300 mg by mouth every 8 (eight) hours as needed for pain.    [provider]  APPLE CIDER VINEGAR PO Take 15 mLs by mouth daily.    [provider]  Calcium Carbonate-Vit D-Min (CALCIUM 1200 PO) Take 1,200 mg  by mouth daily.    [provider]  carbamide peroxide (DEBROX) 6.5 % OTIC solution Place 5 drops into both ears 2 (two) times daily. 08/12/19   Hongalgi, Maximino Greenland, MD  cetirizine (ZYRTEC) 10 MG tablet Take 10 mg by mouth daily as needed for allergies.     [provider]  Cholecalciferol (VITAMIN D PO) Take 5,000 mg by mouth daily.    [provider]  CIPRO 250 MG tablet Take 250 mg by mouth 2 (two) times daily. 05/17/23   [provider]  clindamycin (CLINDAGEL) 1 % gel Apply topically 2 (two) times daily.    [provider]  FINACEA 15 % cream Apply 1 application topically 2 (two) times daily. face 08/06/14   [provider]  levothyroxine (SYNTHROID, LEVOTHROID) 100 MCG tablet Take 100 mcg by mouth daily.      [provider]  Liniments (BLUE-EMU SUPER STRENGTH EX) Apply 1 application topically 4 (four) times daily.    [provider]  losartan-hydrochlorothiazide (HYZAAR) 100-12.5 MG per tablet Take 1 tablet by mouth daily.  11/13/12   [provider]  Magnesium 250 MG TABS Take 500 mg by mouth daily.     [provider]  meloxicam (MOBIC) 15 MG tablet Take 15 mg by mouth daily as needed for pain.    [provider]  methocarbamol (ROBAXIN) 500 MG tablet  Take 500 mg by mouth daily as needed for muscle spasms.     [provider]  Multiple Vitamins-Minerals (ICAPS PO) Take 1 capsule by mouth 2 (two) times daily.    [provider]  niacin 500 MG tablet Take 500 mg by mouth daily.    [provider]  Omega-3 Krill Oil 500 MG CAPS Take 1 capsule by mouth 2 (two) times daily.     [provider]  omeprazole (PRILOSEC) 20 MG capsule Take 20 mg by mouth daily.     [provider]  vitamin C (VITAMIN C) 500 MG tablet Take 1 tablet (500 mg total) by mouth daily. 08/13/19   Hongalgi, Maximino Greenland, MD  VITAMIN E PO Take 1 capsule by mouth daily.    [provider]  VOLTAREN 1 % GEL Apply 2 g topically 4 (four) times daily.  12/30/12   [provider]  zinc sulfate 220 (50 Zn) MG capsule Take 1 capsule (220 mg total) by mouth daily. 08/13/19   Hongalgi, Maximino Greenland, MD      Allergies    Sulfa antibiotics, Doryx [doxycycline], Exemestane, Femara [letrozole], Penicillins, and Suprax [cefixime]    Review of Systems   Review of Systems  Constitutional:  Negative for chills and fever.  HENT:  Negative for sore throat.   Respiratory:  Negative for shortness of breath.   Cardiovascular:  Positive for near-syncope. Negative for chest pain.  Gastrointestinal:  Positive for abdominal pain and nausea. Negative for vomiting.  Genitourinary:  Negative for flank pain.  Neurological:  Negative for light-headedness.  All other systems reviewed and are negative.   Physical Exam Updated Vital Signs BP 101/61   Pulse 67   Temp 97.7 F (36.5 C) (Oral)   Resp 18   Ht 5\' 2"  (1.575 m)   Wt 76.7 kg   SpO2 98%   BMI 30.91 kg/m  Physical Exam Vitals and nursing note reviewed.  Constitutional:      Appearance: Normal appearance. She is not ill-appearing.  HENT:     Head: Normocephalic and atraumatic.     Mouth/Throat:     Mouth: Mucous membranes are moist.  Eyes:     Pupils: Pupils are equal, round, and reactive to light.  Cardiovascular:     Rate and Rhythm: Normal rate.  Pulmonary:     Effort: Pulmonary effort is normal.     Breath sounds: No wheezing or rales.  Abdominal:     General: Abdomen is flat.     Tenderness: There is abdominal tenderness. There is no right CVA tenderness or left CVA tenderness.  Musculoskeletal:     Cervical back: Normal range of motion and neck supple.  Skin:    General: Skin is warm and dry.  Neurological:     Mental Status: She is alert and oriented to person, place, and time.     Comments: Alert, oriented, thought content appropriate. Speech fluent without evidence of aphasia. Able to follow 2 step commands  without difficulty.  Cranial Nerves:  II:  Peripheral visual fields grossly normal, pupils, round, reactive to light III,IV, VI: ptosis not present, extra-ocular motions intact bilaterally  V,VII: smile symmetric, facial light touch sensation equal VIII: hearing grossly normal bilaterally  IX,X: midline uvula rise  XI: bilateral shoulder shrug equal and strong XII: midline tongue extension  Motor:  5/5 in upper and lower extremities bilaterally including strong and equal grip strength and dorsiflexion/plantar flexion Sensory: light touch normal in all extremities.  Cerebellar: normal finger-to-nose with bilateral upper extremities, pronator drift negative Gait: N/A       ED Results / Procedures / Treatments   Labs (all labs ordered are listed, but only abnormal results are displayed) Labs Reviewed  COMPREHENSIVE METABOLIC PANEL - Abnormal; Notable for the following components:      Result Value   Glucose, Bld 131 (*)    Albumin 3.4 (*)    All other components within normal limits  CBG MONITORING, ED - Abnormal; Notable for the following components:   Glucose-Capillary 134 (*)    All other components within normal limits  SARS CORONAVIRUS 2 BY RT PCR  CBC WITH DIFFERENTIAL/PLATELET  TSH  TROPONIN I (HIGH SENSITIVITY)  TROPONIN I (HIGH SENSITIVITY)    EKG EKG Interpretation Date/Time:  Thursday June 10 2023 08:00:03 EDT Ventricular Rate:  70 PR Interval:  144 QRS Duration:  89 QT Interval:  440 QTC Calculation: 475 R Axis:   -19  Text Interpretation: Sinus or ectopic atrial rhythm Multiple ventricular premature complexes with compensatory pause Confirmed by Kommor, Madison ([id number]) on 06/10/2023 8:14:32 AM  Radiology DG Chest Portable 1 View  Result Date: 06/10/2023 CLINICAL DATA:  near syncope EXAM: PORTABLE CHEST 1 VIEW COMPARISON:  Chest x-ray August 10, 2019. FINDINGS: The heart size and mediastinal contours are within normal limits. Both lungs are clear. No  visible pleural effusions or pneumothorax. No acute osseous abnormality. IMPRESSION: No active disease. Electronically Signed   By: Feliberto Harts M.D.   On: 06/10/2023 08:27    Procedures Procedures    Medications Ordered in ED Medications  lactated ringers bolus 1,000 mL (1,000 mLs Intravenous New Bag/Given 06/10/23 0932)    ED Course/ Medical Decision Making/ A&P                                 Medical Decision Making Amount and/or Complexity of Data Reviewed Labs: ordered. Radiology: ordered.   This patient presents to the ED for concern of near syncope, this involves a number of treatment options, and is a complaint that carries with it a high risk of complications and morbidity.  The differential diagnosis includes ACS, vertigo, versus electrolyte abnormality.    Co morbidities: Discussed in HPI   Brief History:  See HPI.   EMR reviewed including pt PMHx, past surgical history and past visits to ER.   See HPI for more details   Lab Tests:  I ordered and independently interpreted labs.  The pertinent results include:    I personally reviewed all laboratory work and imaging. Metabolic panel without any acute abnormality specifically kidney function within normal limits and no significant electrolyte abnormalities. CBC without leukocytosis or significant anemia.   Imaging Studies:  NAD. I personally reviewed all imaging studies and no acute abnormality found. I agree with radiology interpretation.  Cardiac Monitoring:  The patient was maintained on a cardiac monitor.  I personally viewed and interpreted the cardiac monitored which showed an underlying rhythm of: NSR EKG non-ischemic   Medicines ordered:  I ordered medication including bolus  for hydration Reevaluation of the patient after these medicines showed that the patient improved I have reviewed the patients home medicines and have made adjustments as needed  Reevaluation:  After the  interventions noted above I re-evaluated patient and found that they have :improved  Social Determinants of Health:  The patient's social determinants of health were a factor in the care of  this patient   Problem List / ED Course:  88 year old female with a past medical history of hypertension presents to the ED status post near syncopal episode which occurred while she was trying to make scrambled eggs in the kitchen at a church for approximately 2 hours.  Reports episode of sudden onset of dizziness where she felt lightheaded and was lowered towards the ground.  There was no head injury, no loss of consciousness, not anticoagulated.  According to chart review, I do see a prior episode in the past approximately 4 years ago with the same presentation. She has never had a cardiac workup, no prior stress stress. She did have a physical exam 3 weeks ago and was told she was healthy.  On exam she was noted to have a systolic in the 80s, diastolic in the 50s, given 200 cc normal saline by EMS with improvement in her blood pressure.  To the ED with a systolic in the 100, mentating well and answering questions, reports she feels much better although the EMS ride was very hard on her.  She did not have any chest pain, no shortness of breath, no prior cardiac history.  Her labs today are without any leukocytosis, no signs of anemia, no electrolyte derangement, creatinine levels unremarkable, LFTs are within normal limits.  COVID test obtain this is also negative, CBG is unremarkable.  Given a liter bolus to help with hydration.  She does report improvement in her symptoms however due to past medical history, including comorbidities I do not feel that patient is safe for disposition at this time. [facility] Syncope rule, patient NOT in the low-risk group for serious outcome.  She does not have chest pain troponins have remain flat, however EKG with some PVC's present will need to monitor. Called Dr. Katrinka Blazing who  agrees with admission at this time.   Dispostion:  After consideration of the diagnostic results and the patients response to treatment, I feel that the patent would benefit from admission for further management.   Portions of this note were generated with Scientist, clinical (histocompatibility and immunogenetics). Dictation errors may occur despite best attempts at proofreading.   Final Clinical Impression(s) / ED Diagnoses Final diagnoses:  Near syncope    Rx / DC Orders ED Discharge Orders     None         Claude Manges, Cordelia Poche 06/10/23 1106    Kommor, [place], MD 06/10/23 1921

## 2023-06-10 NOTE — H&P (Signed)
History and Physical    Patient: [name] ZHY:[id number] DOB: August 18, 1935 DOA: 06/10/2023 DOS: the patient was seen and examined on 06/10/2023 PCP: Adrian Prince, MD  Patient coming from: [facility] via EMS  Chief Complaint:  Chief Complaint  Patient presents with   Near Syncope   HPI: [name] is a 88 y.o. female with medical history significant of HTN, hyperlipidemia, history of breast cancer, arthritis, and GERD presents with complaints of near syncope.  She had [place] preparing meals at the church since 6 AM.  At around 8 AM this when she felt herself get dizzy and lightheaded like she was going to pass out.  One of the nearby members caught her and was able to lay her down to the ground.  Following this she reported feeling nauseated.  Patient denied any loss of consciousness, chest pain, palpitations, recent sick contacts.  This morning prior to going to trip she had eaten a protein bar and taken her morning medications of levothyroxine and Hyzaar.  She reports that she has had urinary frequency and nocturia.  Patient reports just recently being treated for urinary tract infection a couple weeks ago.  Denies having any discomfort at this time.she had followed up with her primary just couple weeks ago and had a normal physical at that time.  It was reported upon EMS arrival the patient was hypotensive and was given 200 cc of of IV fluids and Zofran prior to arrival.  In the emergency department patient was noted to be afebrile, blood pressures 93/53 to 105/57, and all other vital signs maintained.  EKG Labs are relatively unremarkable including CBC, CMP, and TSH.  Chest x-ray noted no acute abnormality.  Patient had been given 1 L of lactated Ringer's.  TRH called to admit.  Review of Systems: As mentioned in the history of present illness. All other systems reviewed and are negative. Past Medical History:  Diagnosis Date   Arthritis    Arthritis pain    Breast cancer (HCC) 2010    left breast   Cancer (HCC) 04/2009   Left breast cancer   Cough    Cryptosporidial gastroenteritis (HCC)    GERD (gastroesophageal reflux disease)    Hyperlipidemia    Hypertension    Nasal congestion    Personal history of radiation therapy    Sinusitis    Sleep apnea    Thyroid disease    Past Surgical History:  Procedure Laterality Date   BREAST BIOPSY     BREAST LUMPECTOMY Left 2010   BREAST LUMPECTOMY W/ NEEDLE LOCALIZATION Left 06/04/2009   BREAST SURGERY     CARPAL TUNNEL RELEASE     CHOLECYSTECTOMY     DILATION AND CURETTAGE OF UTERUS     ROTATOR CUFF REPAIR     stomach polyps     TONSILLECTOMY     Social History:  reports that she has never smoked. She has never used smokeless tobacco. She reports that she does not drink alcohol and does not use drugs.  Allergies  Allergen Reactions   Sulfa Antibiotics Hives, Itching and Rash   Doryx [Doxycycline] Swelling   Exemestane Itching   Femara [Letrozole] Other (See Comments)    Aching joints    Penicillins Itching    Did it involve swelling of the face/tongue/throat, SOB, or low BP? No Did it involve sudden or severe rash/hives, skin peeling, or any reaction on the inside of your mouth or nose? Yes Did you need to seek medical attention  at a hospital or doctor's office? Yes When did it last happen? unable to recall      If all above answers are "NO", may proceed with cephalosporin use.    Suprax [Cefixime] Itching    Family History  Problem Relation Age of Onset   Diabetes Father    Heart disease Father    Heart attack Father    Diabetes Sister    Diabetes Brother     Prior to Admission medications   Medication Sig Start Date End Date Taking? Authorizing Provider  acetaminophen (TYLENOL 8 HOUR ARTHRITIS PAIN) 650 MG CR tablet Take 1,300 mg by mouth every 8 (eight) hours as needed for pain.    [provider]  APPLE CIDER VINEGAR PO Take 15 mLs by mouth daily.    [provider]   Calcium Carbonate-Vit D-Min (CALCIUM 1200 PO) Take 1,200 mg by mouth daily.    [provider]  carbamide peroxide (DEBROX) 6.5 % OTIC solution Place 5 drops into both ears 2 (two) times daily. 08/12/19   Hongalgi, Maximino Greenland, MD  cetirizine (ZYRTEC) 10 MG tablet Take 10 mg by mouth daily as needed for allergies.     [provider]  Cholecalciferol (VITAMIN D PO) Take 5,000 mg by mouth daily.    [provider]  CIPRO 250 MG tablet Take 250 mg by mouth 2 (two) times daily. 05/17/23   [provider]  clindamycin (CLINDAGEL) 1 % gel Apply topically 2 (two) times daily.    [provider]  FINACEA 15 % cream Apply 1 application topically 2 (two) times daily. face 08/06/14   [provider]  levothyroxine (SYNTHROID, LEVOTHROID) 100 MCG tablet Take 100 mcg by mouth daily.      [provider]  Liniments (BLUE-EMU SUPER STRENGTH EX) Apply 1 application topically 4 (four) times daily.    [provider]  losartan-hydrochlorothiazide (HYZAAR) 100-12.5 MG per tablet Take 1 tablet by mouth daily.  11/13/12   [provider]  Magnesium 250 MG TABS Take 500 mg by mouth daily.     [provider]  meloxicam (MOBIC) 15 MG tablet Take 15 mg by mouth daily as needed for pain.    [provider]  methocarbamol (ROBAXIN) 500 MG tablet Take 500 mg by mouth daily as needed for muscle spasms.     [provider]  Multiple Vitamins-Minerals (ICAPS PO) Take 1 capsule by mouth 2 (two) times daily.    [provider]  niacin 500 MG tablet Take 500 mg by mouth daily.    [provider]  Omega-3 Krill Oil 500 MG CAPS Take 1 capsule by mouth 2 (two) times daily.     [provider]  omeprazole (PRILOSEC) 20 MG capsule Take 20 mg by mouth daily.     [provider]  vitamin C (VITAMIN C) 500 MG tablet Take 1 tablet (500 mg total) by mouth daily. 08/13/19   Hongalgi, Maximino Greenland, MD   VITAMIN E PO Take 1 capsule by mouth daily.    [provider]  VOLTAREN 1 % GEL Apply 2 g topically 4 (four) times daily.  12/30/12   [provider]  zinc sulfate 220 (50 Zn) MG capsule Take 1 capsule (220 mg total) by mouth daily. 08/13/19   Elease Etienne, MD    Physical Exam: Vitals:   06/10/23 0815 06/10/23 0830 06/10/23 0845 06/10/23 0900  BP: (!) 97/56 (!) 93/53 (!) 98/54 101/61  Pulse: 70  66 70 67  Resp: 12 20 15 18   Temp:      TempSrc:      SpO2: 94% 95% 90% 98%  Weight:      Height:        Constitutional: Elderly female currently no acute distress Eyes: PERRL, lids and conjunctivae normal ENMT: Mucous membranes are moist.   Neck: normal, supple   Respiratory: clear to auscultation bilaterally, no wheezing, no crackles. Normal respiratory effort. No accessory muscle use.  Cardiovascular: Regular rate and rhythm, no murmurs / rubs / gallops. No extremity edema.   Abdomen: no tenderness, no masses palpated. Bowel sounds positive.  Musculoskeletal: no clubbing / cyanosis. No joint deformity upper and lower extremities. Good ROM, no contractures. Normal muscle tone.  Skin: no rashes, lesions, ulcers. No induration Neurologic: CN 2-12 grossly intact. Sensation intact.  Strength 5/5 in all 4.  Psychiatric: Normal judgment and insight. Alert and oriented x 3. Normal mood.   Data Reviewed:  EKG reveals sinus or ectopic atrial rhythm with multiple PVCs.  Reviewed labs, imaging, and pertinent records as noted above in HPI  Assessment and Plan:  Near syncope Acute. Patient presents after having a near syncopal episode.  High-sensitivity troponins were negative x 2.  Chest x-ray showed no acute abnormality.  Blood pressures as low as 93/53 here in the emergency department.  Patient is on losartan hydrochlorothiazide for blood pressure which she took this morning, and could likely been a cause for her symptoms.  She has been given 1 L of normal saline IV fluids  while in the ED.  Records note patient had previously been admitted with transient hypotension previously in the past back in 2020 thought secondary to COVID-19 virus infection. -Admit to cardiac telemetry bed -Check orthostatic vital signs -Hold losartan-hydrochlorothiazide.  May need to stop the hydrochlorothiazide.  Recommended patient to follow-up with her primary -Check echocardiogram -Normal saline IV fluids at 75 mL/h overnight -Physical therapy to eval and treat  PVCs Patient noted to have multiple PVCs while in the emergency department.  She denies having any chest pain or palpitations. -Check magnesium level -Follow-up telemetry overnight  Urinary frequency Patient reports having urinary frequency, but denies having any discomfort.  Noted prior history of urinary tract infection. -Check urinalysis with flex to culture  History of breast cancer  Hypothyroidism TSH within normal limits at 1.15 -Continue levothyroxine  DVT prophylaxis: Lovenox Advance Care Planning:   Code Status: Full Code   Consults: None  Family Communication: None requested  Severity of Illness: The appropriate patient status for this patient is OBSERVATION. Observation status is judged to be reasonable and necessary in order to provide the required intensity of service to ensure the patient's safety. The patient's presenting symptoms, physical exam findings, and initial radiographic and laboratory data in the context of their medical condition is felt to place them at decreased risk for further clinical deterioration. Furthermore, it is anticipated that the patient will be medically stable for discharge from the hospital within 2 midnights of admission.   Author: Clydie Braun, MD 06/10/2023 10:50 AM  For on call review [email or web address].

## 2023-06-11 DIAGNOSIS — R55 Syncope and collapse: Secondary | ICD-10-CM | POA: Diagnosis not present

## 2023-06-11 LAB — BASIC METABOLIC PANEL
Anion gap: 7 (ref 5–15)
BUN: 15 mg/dL (ref 8–23)
CO2: 28 mmol/L (ref 22–32)
Calcium: 9.1 mg/dL (ref 8.9–10.3)
Chloride: 103 mmol/L (ref 98–111)
Creatinine, Ser: 0.85 mg/dL (ref 0.44–1.00)
GFR, Estimated: >60 mL/min (ref >60)
Glucose, Bld: 112 mg/dL — ABNORMAL HIGH (ref 70–99)
Potassium: 4.6 mmol/L (ref 3.5–5.1)
Sodium: 138 mmol/L (ref 135–145)

## 2023-06-11 LAB — URINALYSIS, W/ REFLEX TO CULTURE (INFECTION SUSPECTED)
Bilirubin Urine: NEGATIVE
Glucose, UA: NEGATIVE mg/dL
Hgb urine dipstick: NEGATIVE
Ketones, ur: NEGATIVE mg/dL
Leukocytes,Ua: NEGATIVE
Nitrite: NEGATIVE
Protein, ur: NEGATIVE mg/dL
Specific Gravity, Urine: 1.008 (ref 1.005–1.030)
pH: 5 (ref 5.0–8.0)

## 2023-06-11 LAB — CBC
HCT: 39.9 % (ref 36.0–46.0)
Hemoglobin: 12.3 g/dL (ref 12.0–15.0)
MCH: 27.2 pg (ref 26.0–34.0)
MCHC: 30.8 g/dL (ref 30.0–36.0)
MCV: 88.3 fL (ref 80.0–100.0)
Platelets: 155 10*3/uL (ref 150–400)
RBC: 4.52 MIL/uL (ref 3.87–5.11)
RDW: 14.1 % (ref 11.5–15.5)
WBC: 5.9 10*3/uL (ref 4.0–10.5)
nRBC: 0 % (ref 0.0–0.2)

## 2023-06-11 NOTE — Discharge Summary (Signed)
Physician Discharge Summary   Patient: [name] MRN: [id number] DOB: [birth year]  Admit date:     06/10/2023  Discharge date: 06/11/23  Discharge Physician: Rickey Barbara   PCP: Adrian Prince, MD   Recommendations at discharge:    Follow up with PCP in 1-2 weeks Please monitor BP trends closely. Have advise patient to continue to hold home BP meds on d/c until she can be followed up as outpatient  Discharge Diagnoses: Principal Problem:   Near syncope Active Problems:   Frequent PVCs   Urinary frequency   Hypothyroidism  Resolved Problems:   * No resolved hospital problems. *  Hospital Course: 88 y.o. female with medical history significant of HTN, hyperlipidemia, history of breast cancer, arthritis, and GERD presents with complaints of near syncope.  She had been preparing meals at the church since 6 AM.  At around 8 AM this when she felt herself get dizzy and lightheaded like she was going to pass out.  One of the nearby members caught her and was able to lay her down to the ground.  Following this she reported feeling nauseated.  Patient denied any loss of consciousness, chest pain, palpitations, recent sick contacts.  This morning prior to going to trip she had eaten a protein bar and taken her morning medications of levothyroxine and Hyzaar.  She reports that she has had urinary frequency and nocturia.  Patient reports just recently being treated for urinary tract infection a couple weeks ago.  Denies having any discomfort at this time.she had followed up with her primary just couple weeks ago and had a normal physical at that tim   In the ED, BP was noted to be soft initially. Pt was given IVF hydration.  Assessment and Plan: Near syncope -Patient presents after having a near syncopal episode.   -High-sensitivity troponins were negative x 2.   -Initial BP noted to be soft on presentation -Orthostatic vitals were negative, however, pt had already received fluids prior to  obtaining vitals -2d echo reviewed, notable for dilated RV with pulm HTN. Otherwise unremarkable -Pt had reported feeling  -BP trends remained stable off BP meds this visit, consider avoiding too tight of bp control   PVCs Patient noted to have multiple PVCs while in the emergency department.  She denies having any chest pain or palpitations. -remained stable   Urinary frequency Patient reports having urinary frequency, but denies having any discomfort.  Noted prior history of urinary tract infection. -Reviewed UA, unremarkable   History of breast cancer   Hypothyroidism TSH within normal limits at 1.151 -Continue levothyroxine    Consultants:  Procedures performed:   Disposition: Home Diet recommendation:  Regular diet DISCHARGE MEDICATION: Allergies as of 06/11/2023       Reactions   Sulfa Antibiotics Hives, Itching, Rash   Doryx [doxycycline] Swelling   Exemestane Itching   Femara [letrozole] Other (See Comments)   Aching joints   Penicillins Itching   Did it involve swelling of the face/tongue/throat, SOB, or low BP? No Did it involve sudden or severe rash/hives, skin peeling, or any reaction on the inside of your mouth or nose? Yes Did you need to seek medical attention at a hospital or doctor's office? Yes When did it last happen? unable to recall      If all above answers are "NO", may proceed with cephalosporin use.   Suprax [cefixime] Itching        Medication List     STOP taking these medications  Cipro 250 MG tablet Generic drug: ciprofloxacin   losartan-hydrochlorothiazide 100-12.5 MG tablet Commonly known as: HYZAAR   methocarbamol 500 MG tablet Commonly known as: ROBAXIN       TAKE these medications    ascorbic acid 500 MG tablet Commonly known as: VITAMIN C Take 1 tablet (500 mg total) by mouth daily. What changed: how much to take   CALCIUM 1200 PO Take 1,200 mg by mouth daily.   cetirizine 10 MG tablet Commonly known as:  ZYRTEC Take 10 mg by mouth daily as needed for allergies.   clindamycin 1 % gel Commonly known as: CLINDAGEL Apply topically 2 (two) times daily.   cyanocobalamin 500 MCG tablet Commonly known as: VITAMIN B12 Take 500 mcg by mouth daily.   Finacea 15 % gel Generic drug: Azelaic Acid Apply 1 application topically 2 (two) times daily. face   ICAPS PO Take 1 capsule by mouth 2 (two) times daily.   levothyroxine 100 MCG tablet Commonly known as: SYNTHROID Take 50-100 mcg by mouth See admin instructions. Take 1/2 tablet ( ) on Sunday. Take 1 tablet (100mcg) every other day of the week.   Magnesium 250 MG Tabs Take 500 mg by mouth daily.   niacin 500 MG tablet Commonly known as: VITAMIN B3 Take 500 mg by mouth daily.   Omega-3 Krill Oil 500 MG Caps Take 1 capsule by mouth 2 (two) times daily.   omeprazole 20 MG capsule Commonly known as: PRILOSEC Take 20 mg by mouth daily.   OVER THE COUNTER MEDICATION Take 1 capsule by mouth daily. Probiotic   SYSTANE OP Apply 1 drop to eye in the morning and at bedtime.   THERATEARS OP Apply 1 drop to eye in the morning and at bedtime.   Tylenol 8 Hour Arthritis Pain 650 MG CR tablet Generic drug: acetaminophen Take 1,300 mg by mouth every 8 (eight) hours as needed for pain.   VITAMIN D PO Take 1,000 mg by mouth daily.   VITAMIN E PO Take 180 mg by mouth daily.   Voltaren 1 % Gel Generic drug: diclofenac Sodium Apply 2 g topically 4 (four) times daily.   zinc sulfate 220 (50 Zn) MG capsule Take 1 capsule (220 mg total) by mouth daily.        Follow-up Information     South, Edlin Ford, MD Follow up in 2 week(s).   Specialty: Endocrinology Why: Hospital follow up Contact information: [address] [place] [address] [phone]                Discharge Exam: Filed Weights   06/10/23 0758  Weight: 76.7 kg   General exam: Awake, laying in bed, in nad Respiratory system: Normal respiratory  effort, no wheezing Cardiovascular system: regular rate, s1, s2 Gastrointestinal system: Soft, nondistended, positive BS Central nervous system: CN2-12 grossly intact, strength intact Extremities: Perfused, no clubbing Skin: Normal skin turgor, no notable skin lesions seen Psychiatry: Mood normal // no visual hallucinations   Condition at discharge: fair  The results of significant diagnostics from this hospitalization (including imaging, microbiology, ancillary and laboratory) are listed below for reference.   Imaging Studies: ECHOCARDIOGRAM COMPLETE  Result Date: 06/10/2023    ECHOCARDIOGRAM REPORT   Patient Name:   Margaretmary C Mcquaid Date of Exam: 06/10/2023 Medical Rec #:  [id number]        Height:       62 .0 in Accession #:    [id number]       Weight:  169.0 lb Date of Birth:  1934/11/07        BSA:          1.780 m Patient Age:    88 years         BP:           136/83 mmHg Patient Gender: F                HR:           94 bpm. Exam Location:  Inpatient Procedure: 2D Echo, Cardiac Doppler and Color Doppler Indications:    Abnormal ECG R94.31  History:        Patient has no prior history of Echocardiogram examinations.                 Breast Cancer; Risk Factors:Non-Smoker, Hypertension,                 Dyslipidemia and Sleep Apnea.  Sonographer:    Aron Baba Referring Phys: [id number] RONDELL A SMITH  Sonographer Comments: Image acquisition challenging due to patient body habitus and Image acquisition challenging due to respiratory motion. IMPRESSIONS  1. Left ventricular ejection fraction, by estimation, is 55 to 60%. The left ventricle has normal function. The left ventricle has no regional wall motion abnormalities. There is mild left ventricular hypertrophy. Left ventricular diastolic parameters are indeterminate.  2. Right ventricular systolic function is normal. The right ventricular size is dilated. There is mildly elevated pulmonary artery systolic pressure. The estimated right  ventricular systolic pressure is 41.6 mmHg.  3. Left atrial size was mildly dilated.  4. The mitral valve is normal in structure. Mild mitral valve regurgitation.  5. The aortic valve is tricuspid. Aortic valve regurgitation is not visualized. No aortic stenosis is present. Comparison(s): No prior Echocardiogram. FINDINGS  Left Ventricle: Left ventricular ejection fraction, by estimation, is 55 to 60%. The left ventricle has normal function. The left ventricle has no regional wall motion abnormalities. The left ventricular internal cavity size was normal in size. There is  mild left ventricular hypertrophy. Left ventricular diastolic parameters are indeterminate. Right Ventricle: The right ventricular size is dilated. No increase in right ventricular wall thickness. Right ventricular systolic function is normal. There is mildly elevated pulmonary artery systolic pressure. The tricuspid regurgitant velocity is 2.90 m/s, and with an assumed right atrial pressure of 8 mmHg, the estimated right ventricular systolic pressure is 41.6 mmHg. Left Atrium: Left atrial size was mildly dilated. Right Atrium: Right atrial size was normal in size. Pericardium: There is no evidence of pericardial effusion. Mitral Valve: The mitral valve is normal in structure. Mild mitral valve regurgitation. Tricuspid Valve: The tricuspid valve is normal in structure. Tricuspid valve regurgitation is mild. Aortic Valve: The aortic valve is tricuspid. There is mild aortic valve annular calcification. Aortic valve regurgitation is not visualized. No aortic stenosis is present. Pulmonic Valve: The pulmonic valve was grossly normal. Pulmonic valve regurgitation is trivial. Aorta: The aortic root and ascending aorta are structurally normal, with no evidence of dilitation. IAS/Shunts: The atrial septum is grossly normal.  LEFT VENTRICLE PLAX 2D LVIDd:         4.10 cm   Diastology LVIDs:         2.90 cm   LV e' medial:    7.40 cm/s LV PW:         1.10  cm   LV E/e' medial:  12.3 LV IVS:        0.70 cm  LV e' lateral:   7.62 cm/s LVOT diam:     1.90 cm   LV E/e' lateral: 12.0 LV SV:         47 LV SV Index:   27 LVOT Area:     2.84 cm  RIGHT VENTRICLE RV S prime:     12.10 cm/s TAPSE (M-mode): 1.9 cm LEFT ATRIUM             Index        RIGHT ATRIUM           Index LA diam:        4.10 cm 2.30 cm/m   RA Area:     10.10 cm LA Vol (A2C):   45.8 ml 25.74 ml/m  RA Volume:   19.60 ml  11.01 ml/m LA Vol (A4C):   66.0 ml 37.09 ml/m LA Biplane Vol: 58.0 ml 32.59 ml/m  AORTIC VALVE             PULMONIC VALVE LVOT Vmax:   82.00 cm/s  PR End Diast Vel: 6.15 msec LVOT Vmean:  53.600 cm/s LVOT VTI:    0.167 m  AORTA Ao Root diam: 3.30 cm Ao Asc diam:  3.50 cm MITRAL VALVE               TRICUSPID VALVE MV Area (PHT): 4.21 cm    TR Peak grad:   33.6 mmHg MV Decel Time: 180 msec    TR Vmax:        290.00 cm/s MR Peak grad: 29.6 mmHg MR Vmax:      272.00 cm/s  SHUNTS MV E velocity: 91.30 cm/s  Systemic VTI:  0.17 m MV A velocity: 80.50 cm/s  Systemic Diam: 1.90 cm MV E/A ratio:  1.13 Riley Lam MD Electronically signed by Riley Lam MD Signature Date/Time: 06/10/2023/4:57:31 PM    Final    DG Chest Portable 1 View  Result Date: 06/10/2023 CLINICAL DATA:  near syncope EXAM: PORTABLE CHEST 1 VIEW COMPARISON:  Chest x-ray August 10, 2019. FINDINGS: The heart size and mediastinal contours are within normal limits. Both lungs are clear. No visible pleural effusions or pneumothorax. No acute osseous abnormality. IMPRESSION: No active disease. Electronically Signed   By: Feliberto Harts M.D.   On: 06/10/2023 08:27    Microbiology: Results for orders placed or performed during the hospital encounter of 06/10/23  SARS Coronavirus 2 by RT PCR (hospital order, performed in [facility] hospital lab) *cepheid single result test* Anterior Nasal Swab     Status: None   Collection Time: 06/10/23  8:09 AM   Specimen: Anterior Nasal Swab  Result Value Ref  Range Status   SARS Coronavirus 2 by RT PCR NEGATIVE NEGATIVE Final    Comment: Performed at [facility], [address] [place], [place] [address]    Labs: CBC: Recent Labs  Lab 06/10/23 0816 06/11/23 1129  WBC 7.0 5.9  NEUTROABS 5.0  --   HGB 12.0 12.3  HCT 39.5 39.9  MCV 89.6 88.3  PLT 151 155   Basic Metabolic Panel: Recent Labs  Lab 06/10/23 0816 06/10/23 1522 06/11/23 1129  NA 137  --  138  K 3.8  --  4.6  CL 100  --  103  CO2 22  --  28  GLUCOSE 131*  --  112*  BUN 23  --  15  CREATININE 0.83  --  0.85  CALCIUM 9.0  --  9.1  MG  --  2.0  --    Liver Function Tests: Recent Labs  Lab 06/10/23 0816  AST 21  ALT 17  ALKPHOS 76  BILITOT 0.9  PROT 6.6  ALBUMIN 3.4*   CBG: Recent Labs  Lab 06/10/23 0802 06/10/23 1239  GLUCAP 134* 102*    Discharge time spent: less than 30 minutes.  Signed: Rickey Barbara, MD [facility] 06/11/2023

## 2023-06-11 NOTE — Evaluation (Signed)
Physical Therapy Brief Evaluation and Discharge Note Patient Details Name: [name] MRN: [id number] DOB: September 07, 1935 Today's Date: 06/11/2023   History of Present Illness  [name] is a 88 y.o. female with medical history significant of HTN, hyperlipidemia, history of breast cancer, arthritis, and GERD presents with complaints of near syncope.  She had [place] the church since 6 AM.  At around 8 AM this when she felt herself get dizzy and lightheaded like she was going to pass out.  One of the nearby members caught her and was able to lay her down to the ground.  Following this she reported feeling nauseated.  Patient denied any loss of consciousness, chest pain, palpitations, recent sick contacts.  Clinical Impression  Pt presents with admitting diagnosis above. Pt today was able to ambulate in hallway with no AD independently. Orthostatics negative. BP: 132/64 supine, BP: 128/71 seated, BP: 134/64 standing. Pt presents at or near baseline mobility. Pt has no further acute PT needs and will be signing off. Re consult PT if mobility status changes.    PT Assessment Patient does not need any further PT services  Assistance Needed at Discharge  PRN    Equipment Recommendations None recommended by PT  Recommendations for Other Services       Precautions/Restrictions Precautions Precautions: Fall Restrictions Weight Bearing Restrictions: No        Mobility  Bed Mobility Rolling: Modified independent (Device/Increase time) Supine/Sidelying to sit: Modified independent (Device/Increased time) Sit to supine/sidelying: Modified independent (Device/Increased time)    Transfers Overall transfer level: Independent Equipment used: None                    Ambulation/Gait Ambulation/Gait assistance: Independent Gait Distance (Feet): 250 Feet Assistive device: None Gait Pattern/deviations: WFL(Within Functional Limits), Narrow base of support Gait  Speed: Pace WFL General Gait Details: Pt with a narrow BOS but no LOB.  Home Activity Instructions    Stairs            Modified Rankin (Stroke Patients Only)        Balance Overall balance assessment: No apparent balance deficits (not formally assessed)                        Pertinent Vitals/Pain PT - Brief Vital Signs All Vital Signs Stable: Yes Pain Assessment Pain Assessment: No/denies pain     Home Living Family/patient expects to be discharged to:: Private residence Living Arrangements: Alone Available Help at Discharge: Family;Friend(s);Neighbor;Available PRN/intermittently Home Environment: Stairs to enter  Stairs-Number of Steps: 3 Home Equipment: Grab bars - toilet;Cane - single point;Shower seat;Grab bars - tub/shower;Hand held shower head;BSC/3in1        Prior Function Level of Independence: Independent      UE/LE Assessment   UE ROM/Strength/Tone/Coordination: WFL    LE ROM/Strength/Tone/Coordination: [facility]      Communication   Communication Communication: No apparent difficulties     Cognition Overall Cognitive Status: Appears within functional limits for tasks assessed/performed       General Comments General comments (skin integrity, edema, etc.): BP: 132/64 supine, BP: 128/71 seated, BP: 134/64 standing    Exercises     Assessment/Plan    PT Problem List         PT Visit Diagnosis Other abnormalities of gait and mobility (R26.89)    No Skilled PT Patient at baseline level of functioning;Patient is independent with all acitivity/mobility   Co-evaluation  AMPAC 6 Clicks Help needed turning from your back to your side while in a flat bed without using bedrails?: None Help needed moving from lying on your back to sitting on the side of a flat bed without using bedrails?: None Help needed moving to and from a bed to a chair (including a wheelchair)?: None Help needed standing up from a chair  using your arms (e.g., wheelchair or bedside chair)?: None Help needed to walk in hospital room?: None Help needed climbing 3-5 steps with a railing? : None 6 Click Score: 24      End of Session Equipment Utilized During Treatment: Gait belt Activity Tolerance: Patient tolerated treatment well Patient left: in bed;with call bell/phone within reach;with family/visitor present Nurse Communication: Mobility status PT Visit Diagnosis: Other abnormalities of gait and mobility (R26.89)     Time: 1610-9604 PT Time Calculation (min) (ACUTE ONLY): 23 min  Charges:   PT Evaluation $PT Eval Low Complexity: 1 Low PT Treatments $Gait Training: 8-22 mins    Shela Nevin, PT, DPT Acute Rehab Services [phone]   Gladys Damme  06/11/2023, 11:14 AM

## 2023-06-11 NOTE — Hospital Course (Signed)
88 y.o. female with medical history significant of HTN, hyperlipidemia, history of breast cancer, arthritis, and GERD presents with complaints of near syncope.  She had [place] meals at the church since 6 AM.  At around 8 AM this when she felt herself get dizzy and lightheaded like she was going to pass out.  One of the nearby members caught her and was able to lay her down to the ground.  Following this she reported feeling nauseated.  Patient denied any loss of consciousness, chest pain, palpitations, recent sick contacts.  This morning prior to going to trip she had eaten a protein bar and taken her morning medications of levothyroxine and Hyzaar.  She reports that she has had urinary frequency and nocturia.  Patient reports just recently being treated for urinary tract infection a couple weeks ago.  Denies having any discomfort at this time.she had followed up with her primary just couple weeks ago and had a normal physical at that tim   In the ED, BP was noted to be soft initially. Pt was given IVF hydration.

## 2023-06-11 NOTE — Plan of Care (Signed)

## 2023-06-11 NOTE — Progress Notes (Signed)
Patient is being home, self-care. PIV removed. AVS discussed, pt verbalized understanding. Patient left the unit with all belongings, in NAD.

## 2023-06-11 NOTE — Plan of Care (Signed)
Alert and oriented, denies any pain. Vss, no event overnight.  Walked to the bathroom with minimal assistance  denies any dizziness. Ortho static bp charted.  Problem: Education: Goal: Knowledge of General Education information will improve Description: Including pain rating scale, medication(s)/side effects and non-pharmacologic comfort measures Outcome: Progressing   Problem: Clinical Measurements: Goal: Ability to maintain clinical measurements within normal limits will improve Outcome: Progressing   Problem: Activity: Goal: Risk for activity intolerance will decrease Outcome: Progressing   Problem: Elimination: Goal: Will not experience complications related to urinary retention Outcome: Progressing   Problem: Pain Managment: Goal: General experience of comfort will improve Outcome: Progressing

## 2023-06-11 NOTE — Progress Notes (Signed)
   06/11/23 1404  Mobility  Activity Ambulated independently in hallway;Ambulated independently to bathroom  Level of Assistance Independent  Assistive Device None;Other (Comment) (Wallrails)  Distance Ambulated (ft) 325 ft  Activity Response Tolerated fair  Mobility Referral Yes  $Mobility charge 1 Mobility  Mobility Specialist Start Time (ACUTE ONLY) 1345  Mobility Specialist Stop Time (ACUTE ONLY) 1410  Mobility Specialist Time Calculation (min) (ACUTE ONLY) 25 min   Mobility Specialist: Progress Note  Pre-Mobility: HR 73, BP 112/47 (66) Post-Mobility: HR 82, BP 122/71 (87)  Pt agreeable to mobility session - received in bed. Ambulated independently with no AD and occasional use of wallrails. Pt with slight crossover gait at BOS. C/o slight dizziness and lightheadedness, generalized weakness, and shakiness. Pt returned to bed with all needs met - call bell within reach. Family present.   Barnie Mort, BS Mobility Specialist Please contact via SecureChat or Rehab office at [phone].

## 2023-06-11 NOTE — Plan of Care (Signed)
  Problem: Education: Goal: Knowledge of General Education information will improve Description: Including pain rating scale, medication(s)/side effects and non-pharmacologic comfort measures 06/11/2023 1551 by Johnney Killian, RN Outcome: Adequate for Discharge 06/11/2023 1336 by Johnney Killian, RN Outcome: Progressing   Problem: Health Behavior/Discharge Planning: Goal: Ability to manage health-related needs will improve 06/11/2023 1551 by Johnney Killian, RN Outcome: Adequate for Discharge 06/11/2023 1336 by Johnney Killian, RN Outcome: Progressing   Problem: Clinical Measurements: Goal: Ability to maintain clinical measurements within normal limits will improve 06/11/2023 1551 by Johnney Killian, RN Outcome: Adequate for Discharge 06/11/2023 1336 by Johnney Killian, RN Outcome: Progressing Goal: Will remain free from infection 06/11/2023 1551 by Johnney Killian, RN Outcome: Adequate for Discharge 06/11/2023 1336 by Johnney Killian, RN Outcome: Progressing Goal: Diagnostic test results will improve 06/11/2023 1551 by Johnney Killian, RN Outcome: Adequate for Discharge 06/11/2023 1336 by Johnney Killian, RN Outcome: Progressing Goal: Respiratory complications will improve 06/11/2023 1551 by Johnney Killian, RN Outcome: Adequate for Discharge 06/11/2023 1336 by Johnney Killian, RN Outcome: Progressing Goal: Cardiovascular complication will be avoided 06/11/2023 1551 by Johnney Killian, RN Outcome: Adequate for Discharge 06/11/2023 1336 by Johnney Killian, RN Outcome: Progressing   Problem: Activity: Goal: Risk for activity intolerance will decrease 06/11/2023 1551 by Johnney Killian, RN Outcome: Adequate for Discharge 06/11/2023 1336 by Johnney Killian, RN Outcome: Progressing   Problem: Nutrition: Goal: Adequate nutrition will be maintained 06/11/2023 1551 by Johnney Killian, RN Outcome: Adequate for Discharge 06/11/2023 1336 by Johnney Killian, RN Outcome: Progressing    Problem: Coping: Goal: Level of anxiety will decrease 06/11/2023 1551 by Johnney Killian, RN Outcome: Adequate for Discharge 06/11/2023 1336 by Johnney Killian, RN Outcome: Progressing   Problem: Elimination: Goal: Will not experience complications related to bowel motility 06/11/2023 1551 by Johnney Killian, RN Outcome: Adequate for Discharge 06/11/2023 1336 by Johnney Killian, RN Outcome: Progressing Goal: Will not experience complications related to urinary retention 06/11/2023 1551 by Johnney Killian, RN Outcome: Adequate for Discharge 06/11/2023 1336 by Johnney Killian, RN Outcome: Progressing   Problem: Pain Managment: Goal: General experience of comfort will improve 06/11/2023 1551 by Johnney Killian, RN Outcome: Adequate for Discharge 06/11/2023 1336 by Johnney Killian, RN Outcome: Progressing   Problem: Safety: Goal: Ability to remain free from injury will improve 06/11/2023 1551 by Johnney Killian, RN Outcome: Adequate for Discharge 06/11/2023 1336 by Johnney Killian, RN Outcome: Progressing   Problem: Skin Integrity: Goal: Risk for impaired skin integrity will decrease 06/11/2023 1551 by Johnney Killian, RN Outcome: Adequate for Discharge 06/11/2023 1336 by Johnney Killian, RN Outcome: Progressing

## 2023-10-15 ENCOUNTER — Other Ambulatory Visit: Payer: Self-pay | Admitting: Endocrinology

## 2023-10-15 DIAGNOSIS — Z1231 Encounter for screening mammogram for malignant neoplasm of breast: Secondary | ICD-10-CM

## 2023-11-24 ENCOUNTER — Ambulatory Visit
Admission: RE | Admit: 2023-11-24 | Discharge: 2023-11-24 | Disposition: A | Discharge status: Home / Self Care | Payer: Medicare Other | Source: Ambulatory Visit | Attending: Endocrinology | Admitting: Endocrinology

## 2023-11-24 DIAGNOSIS — Z1231 Encounter for screening mammogram for malignant neoplasm of breast: Secondary | ICD-10-CM

## 2024-05-31 ENCOUNTER — Encounter (HOSPITAL_COMMUNITY): Payer: Self-pay | Admitting: Internal Medicine

## 2024-05-31 ENCOUNTER — Ambulatory Visit (HOSPITAL_COMMUNITY)
Admission: RE | Admit: 2024-05-31 | Discharge: 2024-05-31 | Disposition: A | Discharge status: Home / Self Care | Source: Ambulatory Visit | Attending: Internal Medicine | Admitting: Internal Medicine

## 2024-05-31 VITALS — BP 136/84 | HR 77 | Ht 62.0 in | Wt 160.8 lb

## 2024-05-31 DIAGNOSIS — I480 Paroxysmal atrial fibrillation: Secondary | ICD-10-CM | POA: Diagnosis not present

## 2024-05-31 DIAGNOSIS — D6869 Other thrombophilia: Secondary | ICD-10-CM

## 2024-05-31 DIAGNOSIS — I4891 Unspecified atrial fibrillation: Secondary | ICD-10-CM

## 2024-05-31 MED ORDER — APIXABAN 5 MG PO TABS: 5.0000 mg | ORAL_TABLET | Freq: Two times a day (BID) | ORAL | 1 refills | Status: AC

## 2024-05-31 NOTE — Progress Notes (Signed)
 Primary Care Physician: Nichole Senior, MD Primary Cardiologist: None Electrophysiologist: None     Referring Physician: Nichole Senior, MD     [name] is a 89 y.o. female with a history of breast cancer, dyslipidemia, sleep apnea, GERD, frequent PVCs, near syncope, and atrial fibrillation who presents for consultation in the [facility]. Seen by PCP on 8/20 and noted to be in new Afib/flutter. Patient is on Eliquis  2.5 mg BID for a CHADS2VASC score of at least 4.  On evaluation today, patient is currently in NSR. She notes she felt great on day of PCP visit. She has been taking Eliquis  without issue and notes no bleeding problems.   Today, she denies symptoms of palpitations, chest pain, shortness of breath, orthopnea, PND, lower extremity edema, dizziness, presyncope, syncope, snoring, daytime somnolence, bleeding, or neurologic sequela. The patient is tolerating medications without difficulties and is otherwise without complaint today.    she has a BMI of Body mass index is 29.41 kg/m.SABRA Filed Weights   05/31/24 0855  Weight: 72.9 kg    Current Outpatient Medications  Medication Sig Dispense Refill   acetaminophen  (TYLENOL  8 HOUR ARTHRITIS PAIN) 650 MG CR tablet Take 1,300 mg by mouth every 8 (eight) hours as needed for pain.     apixaban  (ELIQUIS ) 2.5 MG TABS tablet Take 2.5 mg by mouth 2 (two) times daily.     Calcium Carbonate-Vit D-Min (CALCIUM 1200 PO) Take 1,200 mg by mouth daily.     Carboxymethylcellulose Sodium (THERATEARS OP) Apply 1 drop to eye in the morning and at bedtime.     cetirizine (ZYRTEC) 10 MG tablet Take 10 mg by mouth daily as needed for allergies.      Cholecalciferol (VITAMIN D  PO) Take 1,000 mg by mouth daily.     clindamycin (CLINDAGEL) 1 % gel Apply topically 2 (two) times daily.     clobetasol ointment (TEMOVATE) 0.05 % Apply 1 Application topically 2 (two) times daily.     cyanocobalamin (VITAMIN B12) 500 MCG  tablet Take 500 mcg by mouth daily.     FINACEA 15 % cream Apply 1 application topically 2 (two) times daily. face  2   levothyroxine  (SYNTHROID , LEVOTHROID) 100 MCG tablet Take 50-100 mcg by mouth See admin instructions. Take 1/2 tablet (50mcg) on Sunday. Take 1 tablet ( ) every other day of the week.     Magnesium  250 MG TABS Take 500 mg by mouth daily.      Multiple Vitamins-Minerals (ICAPS PO) Take 1 capsule by mouth 2 (two) times daily.     niacin 500 MG tablet Take 500 mg by mouth daily.     Omega-3 Krill Oil 500 MG CAPS Take 1 capsule by mouth 2 (two) times daily.      omeprazole (PRILOSEC) 20 MG capsule Take 20 mg by mouth daily.      OVER THE COUNTER MEDICATION Take 1 capsule by mouth daily. Probiotic     Polyethyl Glycol-Propyl Glycol (SYSTANE OP) Apply 1 drop to eye in the morning and at bedtime.     vitamin C  (VITAMIN C ) 500 MG tablet Take 1 tablet (500 mg total) by mouth daily. (Patient taking differently: Take 1,000 mg by mouth daily.)     VOLTAREN  1 % GEL Apply 2 g topically 4 (four) times daily.      zinc  sulfate 220 (50 Zn) MG capsule Take 1 capsule (220 mg total) by mouth daily.     No current facility-administered medications  for this encounter.    Atrial Fibrillation Management history:  Previous antiarrhythmic drugs: none Previous cardioversions: none Previous ablations: none Anticoagulation history: Eliquis  2.5 mg BID   ROS- All systems are reviewed and negative except as per the HPI above.  Physical Exam: BP 136/84   Pulse 77   Ht 5' 2 (1.575 m)   Wt 72.9 kg   BMI 29.41 kg/m   GEN: Well nourished, well developed in no acute distress NECK: No JVD; No carotid bruits CARDIAC: Regular rate and rhythm, no murmurs, rubs, gallops RESPIRATORY:  Clear to auscultation without rales, wheezing or rhonchi  ABDOMEN: Soft, non-tender, non-distended EXTREMITIES:  No edema; No deformity   EKG today demonstrates  Vent. rate 77 BPM PR interval 208 ms QRS  duration 94 ms QT/QTcB 406/459 ms P-R-T axes 61 -42 -38 Normal sinus rhythm Left axis deviation Septal infarct , age undetermined Possible Lateral infarct , age undetermined Abnormal ECG When compared with ECG of 10-Jun-2023 08:00, PREVIOUS ECG IS PRESENT  Echo 06/10/23 demonstrated  1. Left ventricular ejection fraction, by estimation, is 55 to 60%. The  left ventricle has normal function. The left ventricle has no regional  wall motion abnormalities. There is mild left ventricular hypertrophy.  Left ventricular diastolic parameters  are indeterminate.   2. Right ventricular systolic function is normal. The right ventricular  size is dilated. There is mildly elevated pulmonary artery systolic  pressure. The estimated right ventricular systolic pressure is 41.6 mmHg.   3. Left atrial size was mildly dilated.   4. The mitral valve is normal in structure. Mild mitral valve  regurgitation.   5. The aortic valve is tricuspid. Aortic valve regurgitation is not  visualized. No aortic stenosis is present.   ASSESSMENT & PLAN CHA2DS2-VASc Score = 3  The patient's score is based upon: CHF History: 0 HTN History: 0 Diabetes History: 0 Stroke History: 0 Vascular Disease History: 0 Age Score: 2 Gender Score: 1       ASSESSMENT AND PLAN: Paroxysmal Atrial Fibrillation (ICD10:  I48.0) The patient's CHA2DS2-VASc score is 3, indicating a 3.2% annual risk of stroke.    She is currently in NSR. Education provided about Afib. Discussion about triggers for Afib. After discussion, we will proceed with conservative observation at this time. Rhythm monitoring device recommended.    Secondary Hypercoagulable State (ICD10:  D68.69) The patient is at significant risk for stroke/thromboembolism based upon her CHA2DS2-VASc Score of 3.  Continue Apixaban  (Eliquis ).  Continue Eliquis . We discussed the reasoning behind anticoagulation in the setting of stroke prevention related to Afib. We discussed  the benefits vs risks of anticoagulation. After discussion, patient would like to continue anticoagulation. I do note that patient's weight is > 60 kg and creatinine is below 0.9; correct dosage based on these guidelines is 5 mg BID. Will send prescription of Eliquis  5 mg BID.      Follow up January Afib clinic.   [facility]  [facility] [address] [place], [place] [address] [phone]

## 2024-05-31 NOTE — Patient Instructions (Signed)
 Discontinue Eliquis  2.5 mg  Start Eliquis  5 mg- Taking 1 tablet by mouth twice daily

## 2024-10-03 ENCOUNTER — Encounter (HOSPITAL_COMMUNITY): Payer: Self-pay

## 2024-10-03 ENCOUNTER — Ambulatory Visit (HOSPITAL_COMMUNITY): Admitting: Internal Medicine

## 2024-10-16 ENCOUNTER — Ambulatory Visit (HOSPITAL_COMMUNITY): Admitting: Internal Medicine

## 2024-10-25 ENCOUNTER — Ambulatory Visit (HOSPITAL_COMMUNITY): Admitting: Internal Medicine

## 2024-11-20 ENCOUNTER — Ambulatory Visit (HOSPITAL_COMMUNITY): Admitting: Internal Medicine
